# Patient Record
Sex: Female | Born: 1940 | Race: Black or African American | Hispanic: No | Smoking: Never smoker
Health system: Southern US, Community
[De-identification: ages and names within clinical notes are randomized; demographics above are authoritative.]

## PROBLEM LIST (undated history)

## (undated) DIAGNOSIS — T7840XA Allergy, unspecified, initial encounter: Secondary | ICD-10-CM

## (undated) DIAGNOSIS — R3129 Other microscopic hematuria: Secondary | ICD-10-CM

## (undated) DIAGNOSIS — E785 Hyperlipidemia, unspecified: Secondary | ICD-10-CM

## (undated) DIAGNOSIS — R7611 Nonspecific reaction to tuberculin skin test without active tuberculosis: Secondary | ICD-10-CM

## (undated) DIAGNOSIS — Z87440 Personal history of urinary (tract) infections: Secondary | ICD-10-CM

## (undated) DIAGNOSIS — F419 Anxiety disorder, unspecified: Secondary | ICD-10-CM

## (undated) DIAGNOSIS — K219 Gastro-esophageal reflux disease without esophagitis: Secondary | ICD-10-CM

## (undated) HISTORY — DX: Personal history of urinary (tract) infections: Z87.440

## (undated) HISTORY — DX: Other microscopic hematuria: R31.29

## (undated) HISTORY — DX: Nonspecific reaction to tuberculin skin test without active tuberculosis: R76.11

## (undated) HISTORY — DX: Hyperlipidemia, unspecified: E78.5

## (undated) HISTORY — PX: CHOLECYSTECTOMY: SHX55

## (undated) HISTORY — DX: Anxiety disorder, unspecified: F41.9

## (undated) HISTORY — PX: ABDOMINAL HYSTERECTOMY: SHX81

## (undated) HISTORY — DX: Allergy, unspecified, initial encounter: T78.40XA

## (undated) HISTORY — PX: CYSTOSTOMY W/ BLADDER BIOPSY: SHX1431

## (undated) HISTORY — DX: Gastro-esophageal reflux disease without esophagitis: K21.9

---

## 1976-01-04 HISTORY — PX: BREAST SURGERY: SHX581

## 1997-07-29 ENCOUNTER — Other Ambulatory Visit: Admission: RE | Admit: 1997-07-29 | Discharge: 1997-07-29 | Payer: Self-pay | Admitting: Urology

## 1997-12-01 ENCOUNTER — Other Ambulatory Visit: Admission: RE | Admit: 1997-12-01 | Discharge: 1997-12-01 | Payer: Self-pay | Admitting: Gastroenterology

## 1998-04-15 ENCOUNTER — Emergency Department (HOSPITAL_COMMUNITY): Admission: EM | Admit: 1998-04-15 | Discharge: 1998-04-15 | Payer: Self-pay | Admitting: Emergency Medicine

## 1998-09-30 ENCOUNTER — Other Ambulatory Visit: Admission: RE | Admit: 1998-09-30 | Discharge: 1998-09-30 | Payer: Self-pay | Admitting: Urology

## 1998-10-13 ENCOUNTER — Other Ambulatory Visit: Admission: RE | Admit: 1998-10-13 | Discharge: 1998-10-13 | Payer: Self-pay | Admitting: Internal Medicine

## 1999-07-13 ENCOUNTER — Encounter: Admission: RE | Admit: 1999-07-13 | Discharge: 1999-07-13 | Payer: Self-pay | Admitting: Internal Medicine

## 1999-07-13 ENCOUNTER — Encounter: Payer: Self-pay | Admitting: Internal Medicine

## 1999-07-14 ENCOUNTER — Encounter: Admission: RE | Admit: 1999-07-14 | Discharge: 1999-07-28 | Payer: Self-pay | Admitting: Internal Medicine

## 1999-09-29 ENCOUNTER — Other Ambulatory Visit: Admission: RE | Admit: 1999-09-29 | Discharge: 1999-09-29 | Payer: Self-pay | Admitting: Internal Medicine

## 1999-10-20 ENCOUNTER — Other Ambulatory Visit: Admission: RE | Admit: 1999-10-20 | Discharge: 1999-10-20 | Payer: Self-pay | Admitting: Urology

## 1999-10-26 ENCOUNTER — Encounter: Admission: RE | Admit: 1999-10-26 | Discharge: 2000-01-24 | Payer: Self-pay | Admitting: Internal Medicine

## 2000-10-26 ENCOUNTER — Other Ambulatory Visit: Admission: RE | Admit: 2000-10-26 | Discharge: 2000-10-26 | Payer: Self-pay | Admitting: Urology

## 2000-11-10 ENCOUNTER — Other Ambulatory Visit: Admission: RE | Admit: 2000-11-10 | Discharge: 2000-11-10 | Payer: Self-pay | Admitting: Internal Medicine

## 2001-06-12 ENCOUNTER — Encounter: Admission: RE | Admit: 2001-06-12 | Discharge: 2001-09-10 | Payer: Self-pay | Admitting: Internal Medicine

## 2001-11-22 ENCOUNTER — Other Ambulatory Visit: Admission: RE | Admit: 2001-11-22 | Discharge: 2001-11-22 | Payer: Self-pay | Admitting: Urology

## 2001-12-10 ENCOUNTER — Other Ambulatory Visit: Admission: RE | Admit: 2001-12-10 | Discharge: 2001-12-10 | Payer: Self-pay | Admitting: Internal Medicine

## 2001-12-24 ENCOUNTER — Other Ambulatory Visit: Admission: RE | Admit: 2001-12-24 | Discharge: 2001-12-24 | Payer: Self-pay | Admitting: Urology

## 2002-12-19 ENCOUNTER — Other Ambulatory Visit: Admission: RE | Admit: 2002-12-19 | Discharge: 2002-12-19 | Payer: Self-pay | Admitting: Internal Medicine

## 2003-08-28 ENCOUNTER — Encounter: Admission: RE | Admit: 2003-08-28 | Discharge: 2003-08-28 | Payer: Self-pay | Admitting: Internal Medicine

## 2003-09-04 ENCOUNTER — Encounter: Admission: RE | Admit: 2003-09-04 | Discharge: 2003-09-11 | Payer: Self-pay | Admitting: Internal Medicine

## 2003-12-15 ENCOUNTER — Encounter (INDEPENDENT_AMBULATORY_CARE_PROVIDER_SITE_OTHER): Payer: Self-pay | Admitting: *Deleted

## 2003-12-15 ENCOUNTER — Ambulatory Visit (HOSPITAL_COMMUNITY): Admission: RE | Admit: 2003-12-15 | Discharge: 2003-12-15 | Payer: Self-pay | Admitting: Gastroenterology

## 2004-02-17 ENCOUNTER — Other Ambulatory Visit: Admission: RE | Admit: 2004-02-17 | Discharge: 2004-02-17 | Payer: Self-pay | Admitting: Internal Medicine

## 2004-04-06 ENCOUNTER — Ambulatory Visit: Payer: Self-pay

## 2004-04-26 ENCOUNTER — Encounter: Admission: RE | Admit: 2004-04-26 | Discharge: 2004-04-26 | Payer: Self-pay | Admitting: Orthopedic Surgery

## 2005-02-17 ENCOUNTER — Other Ambulatory Visit: Admission: RE | Admit: 2005-02-17 | Discharge: 2005-02-17 | Payer: Self-pay | Admitting: Internal Medicine

## 2006-08-29 ENCOUNTER — Encounter: Admission: RE | Admit: 2006-08-29 | Discharge: 2006-08-29 | Payer: Self-pay | Admitting: Internal Medicine

## 2007-11-02 ENCOUNTER — Ambulatory Visit: Payer: Self-pay | Admitting: Internal Medicine

## 2008-03-04 ENCOUNTER — Ambulatory Visit: Payer: Self-pay | Admitting: Internal Medicine

## 2008-03-04 ENCOUNTER — Other Ambulatory Visit: Admission: RE | Admit: 2008-03-04 | Discharge: 2008-03-04 | Payer: Self-pay | Admitting: Internal Medicine

## 2008-06-10 ENCOUNTER — Ambulatory Visit: Payer: Self-pay | Admitting: Internal Medicine

## 2008-09-09 ENCOUNTER — Ambulatory Visit: Payer: Self-pay | Admitting: Internal Medicine

## 2008-11-04 ENCOUNTER — Ambulatory Visit: Payer: Self-pay | Admitting: Internal Medicine

## 2008-11-24 ENCOUNTER — Emergency Department (HOSPITAL_COMMUNITY): Admission: EM | Admit: 2008-11-24 | Discharge: 2008-11-24 | Payer: Self-pay | Admitting: Emergency Medicine

## 2008-12-02 ENCOUNTER — Ambulatory Visit: Payer: Self-pay | Admitting: Internal Medicine

## 2009-05-05 ENCOUNTER — Ambulatory Visit: Payer: Self-pay | Admitting: Internal Medicine

## 2009-08-04 ENCOUNTER — Ambulatory Visit: Payer: Self-pay | Admitting: Internal Medicine

## 2009-11-10 ENCOUNTER — Ambulatory Visit: Payer: Self-pay | Admitting: Internal Medicine

## 2010-04-07 LAB — CBC
HCT: 42.1 % (ref 36.0–46.0)
MCHC: 33.8 g/dL (ref 30.0–36.0)
MCV: 93.8 fL (ref 78.0–100.0)
Platelets: 252 10*3/uL (ref 150–400)
RBC: 4.48 MIL/uL (ref 3.87–5.11)
WBC: 9.7 10*3/uL (ref 4.0–10.5)

## 2010-04-07 LAB — URINALYSIS, ROUTINE W REFLEX MICROSCOPIC
Glucose, UA: 250 mg/dL — AB
Leukocytes, UA: NEGATIVE
pH: 8 (ref 5.0–8.0)

## 2010-04-07 LAB — URINE MICROSCOPIC-ADD ON

## 2010-04-07 LAB — DIFFERENTIAL
Basophils Absolute: 0 10*3/uL (ref 0.0–0.1)
Basophils Relative: 0 % (ref 0–1)
Lymphocytes Relative: 7 % — ABNORMAL LOW (ref 12–46)

## 2010-04-07 LAB — COMPREHENSIVE METABOLIC PANEL
AST: 22 U/L (ref 0–37)
Alkaline Phosphatase: 82 U/L (ref 39–117)
CO2: 28 mEq/L (ref 19–32)
Calcium: 9.2 mg/dL (ref 8.4–10.5)
Chloride: 98 mEq/L (ref 96–112)
GFR calc Af Amer: >60 mL/min (ref >60)
GFR calc non Af Amer: >60 mL/min (ref >60)
Glucose, Bld: 202 mg/dL — ABNORMAL HIGH (ref 70–99)
Potassium: 2.9 mEq/L — ABNORMAL LOW (ref 3.5–5.1)

## 2010-05-10 ENCOUNTER — Other Ambulatory Visit: Payer: Medicare Other | Admitting: Internal Medicine

## 2010-05-11 ENCOUNTER — Ambulatory Visit (INDEPENDENT_AMBULATORY_CARE_PROVIDER_SITE_OTHER): Payer: Medicare Other | Admitting: Internal Medicine

## 2010-05-11 ENCOUNTER — Encounter: Payer: Self-pay | Admitting: Internal Medicine

## 2010-05-11 VITALS — BP 149/78 | HR 86 | Temp 97.6°F | Ht 61.0 in | Wt 196.0 lb

## 2010-05-11 DIAGNOSIS — E559 Vitamin D deficiency, unspecified: Secondary | ICD-10-CM | POA: Insufficient documentation

## 2010-05-11 DIAGNOSIS — E785 Hyperlipidemia, unspecified: Secondary | ICD-10-CM

## 2010-05-11 DIAGNOSIS — R3129 Other microscopic hematuria: Secondary | ICD-10-CM

## 2010-05-11 DIAGNOSIS — M5126 Other intervertebral disc displacement, lumbar region: Secondary | ICD-10-CM

## 2010-05-11 DIAGNOSIS — J309 Allergic rhinitis, unspecified: Secondary | ICD-10-CM

## 2010-05-11 DIAGNOSIS — B372 Candidiasis of skin and nail: Secondary | ICD-10-CM

## 2010-05-11 DIAGNOSIS — K219 Gastro-esophageal reflux disease without esophagitis: Secondary | ICD-10-CM

## 2010-05-11 DIAGNOSIS — Z860101 Personal history of adenomatous and serrated colon polyps: Secondary | ICD-10-CM

## 2010-05-11 DIAGNOSIS — Z86010 Personal history of colonic polyps: Secondary | ICD-10-CM

## 2010-05-11 DIAGNOSIS — E119 Type 2 diabetes mellitus without complications: Secondary | ICD-10-CM

## 2010-05-11 DIAGNOSIS — Z87440 Personal history of urinary (tract) infections: Secondary | ICD-10-CM

## 2010-05-11 HISTORY — DX: Personal history of urinary (tract) infections: Z87.440

## 2010-05-11 LAB — POCT URINALYSIS DIPSTICK
Bilirubin, UA: NEGATIVE
Glucose, UA: NEGATIVE
Nitrite, UA: NEGATIVE
Protein, UA: NEGATIVE
Urobilinogen, UA: NEGATIVE
pH, UA: 5

## 2010-05-11 MED ORDER — ATORVASTATIN CALCIUM 20 MG PO TABS: 20.0000 mg | ORAL_TABLET | Freq: Every day | ORAL | Status: DC

## 2010-05-11 NOTE — Progress Notes (Signed)
  Subjective:    Patient ID: Leah Barron, female    DOB: June 07, 1940, 70 y.o.   MRN: [id number]  HPI 70 year old Black female here for evaluation of medical problems and health maintenance. Multiple medical problems including HTN, GERD,Hyperlipidemia, microscopic hematuria, adenomatous colon polyps, allergic rhinitis, anxiety,intertrigo, herniated disc L5-S1,AODM,Hx +PPD,Vitamin D deficiency.    Review of Systems  Constitutional: Positive for fatigue. Negative for appetite change and unexpected weight change.  HENT: Positive for tinnitus.   Eyes: Positive for itching.  Respiratory: Negative for cough, chest tightness, shortness of breath and wheezing.   Cardiovascular: Negative for chest pain, palpitations and leg swelling.  Genitourinary: Positive for frequency. Negative for dysuria and difficulty urinating.  Musculoskeletal: Negative for joint swelling.  Neurological: Negative for dizziness, seizures, syncope, weakness and headaches.  Hematological: Does not bruise/bleed easily.  Psychiatric/Behavioral: Negative for hallucinations, confusion and dysphoric mood.       Objective:   Physical Exam  Constitutional: She is oriented to person, place, and time. She appears well-nourished.  HENT:  Head: Normocephalic and atraumatic.  Eyes: Conjunctivae are normal. Pupils are equal, round, and reactive to light. No scleral icterus.  Neck: Neck supple. No JVD present. No tracheal deviation present. No thyromegaly present.  Cardiovascular: Normal rate, regular rhythm, normal heart sounds and intact distal pulses.   No murmur heard. Pulmonary/Chest: Effort normal and breath sounds normal. No respiratory distress. She has no wheezes. She has no rales.  Abdominal: She exhibits no distension and no mass. There is no tenderness. There is no rebound and no guarding.  Musculoskeletal: She exhibits no edema and no tenderness.  Lymphadenopathy:    She has no cervical adenopathy.  Neurological: She  is alert and oriented to person, place, and time. She has normal reflexes. No cranial nerve deficit. She exhibits normal muscle tone. Coordination normal.  Skin: Skin is warm and dry. No rash noted. No erythema.  Psychiatric: She has a normal mood and affect. Her behavior is normal. Judgment and thought content normal.          Assessment & Plan:  AODM_ fairly well controlled; Hypertension controlled; GERD-stable;  Adenomatous polyps-Pt to call Dr Dorena Cookey about repeat study; Intertrigo-stable; Allergic rhinitis- treated with OTC meds; Anxiety-stable; Back pain has gone away for now. Microscopic hematuria to be checked by urologist in Sept 2012. Hyperlipidemia under excellent control on generic Lipitor but co-pay is $45.  RTC in 6 months and will have AIC,Lipid and Liver panels with OV.

## 2010-05-12 IMAGING — CT CT HEAD W/O CM
1 of 2 series · 13 of 30 positions shown, 17 images · non-contrast
Comparison: None

CLINICAL DATA: Vertigo with nausea, vomiting and headache.  History
of hypertension and diabetes.

CT HEAD WITHOUT CONTRAST
TECHNIQUE: Contiguous axial images were obtained from the base of
the skull through the vertex without contrast.

[Series 2: brain · axial · 0.47mm/px · z∈[+146,+271]mm · 13 of 28 slices shown, 17 images]
[im 2/28  brain]
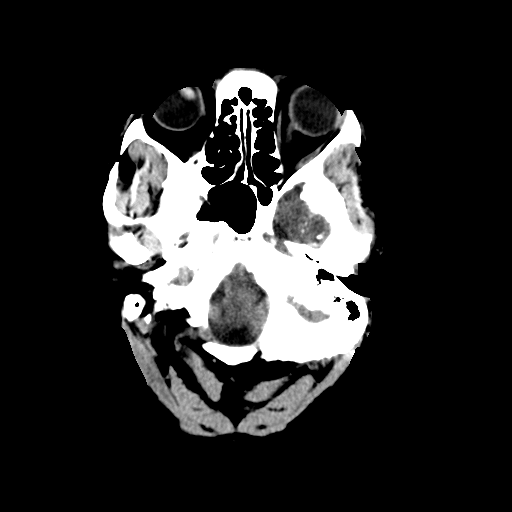
[im 2/28  bone]
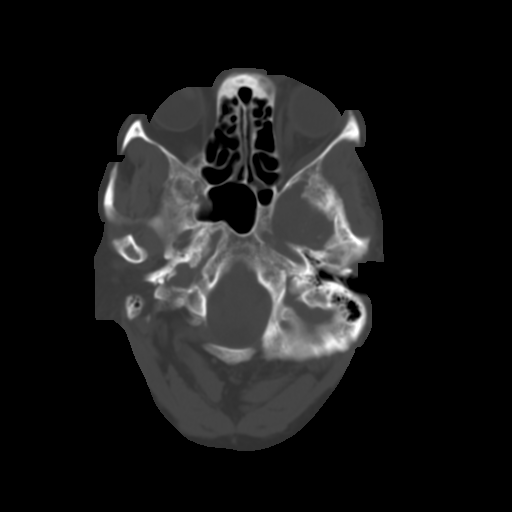
[im 4/28  brain]
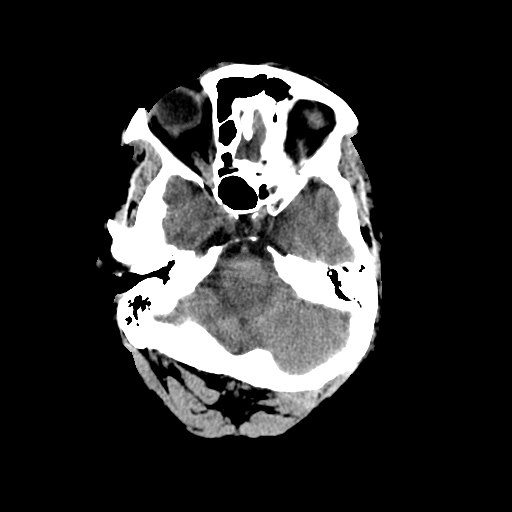
[im 6/28  brain]
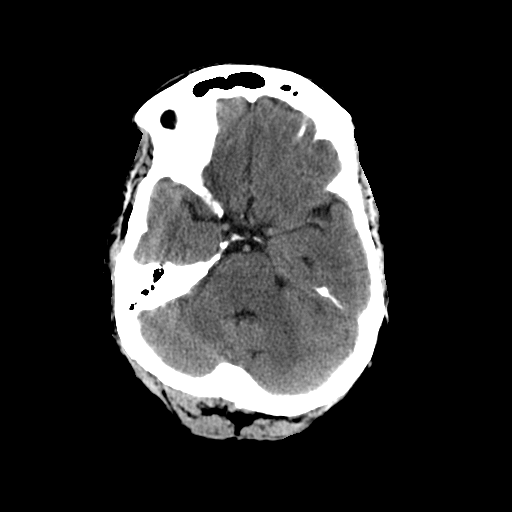
[im 8/28  brain]
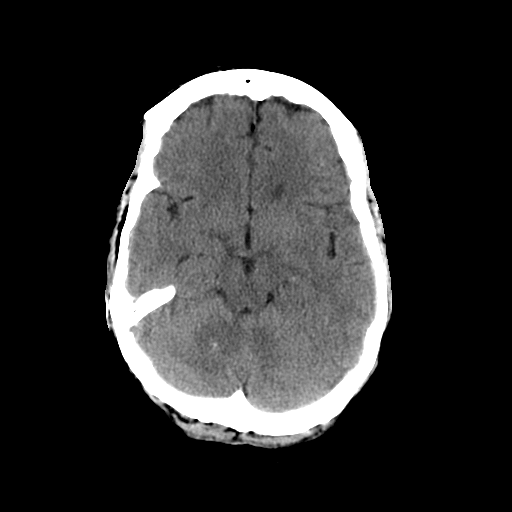
[im 10/28  brain]
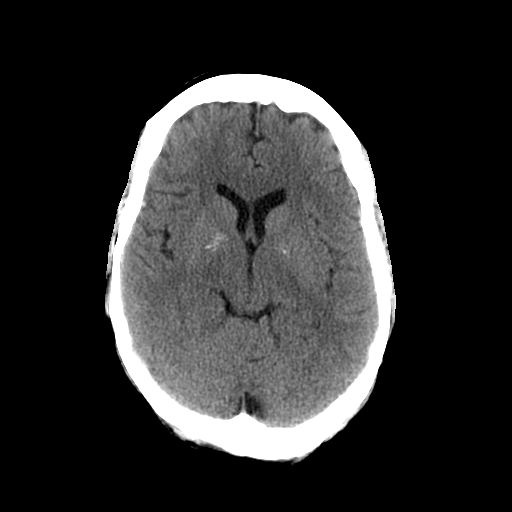
[im 10/28  bone]
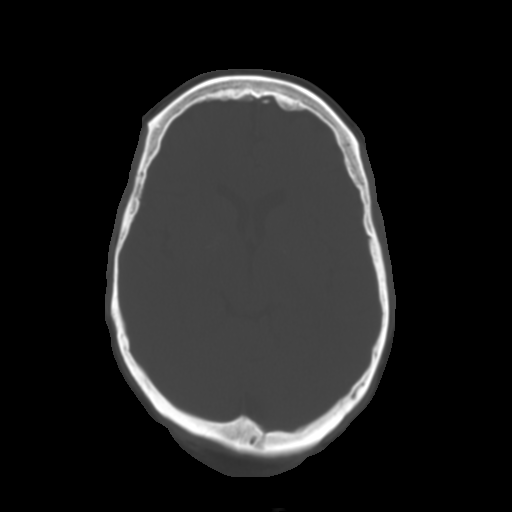
[im 12/28  brain]
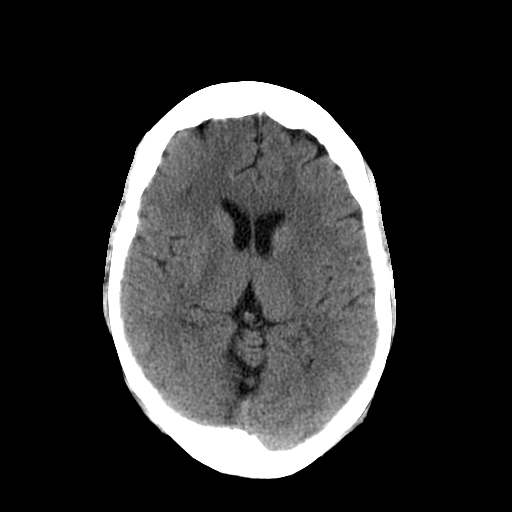
[im 14/28  brain]
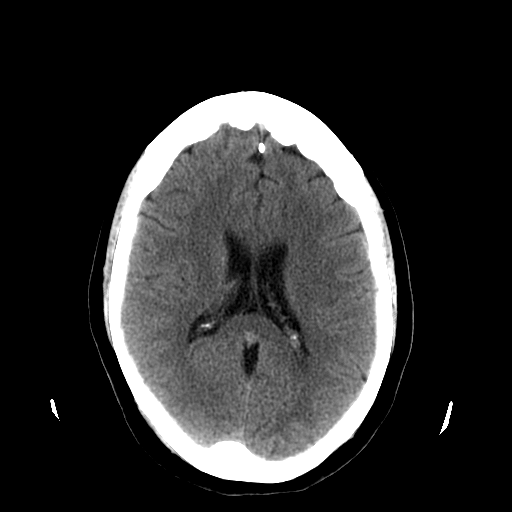
[im 16/28  brain]
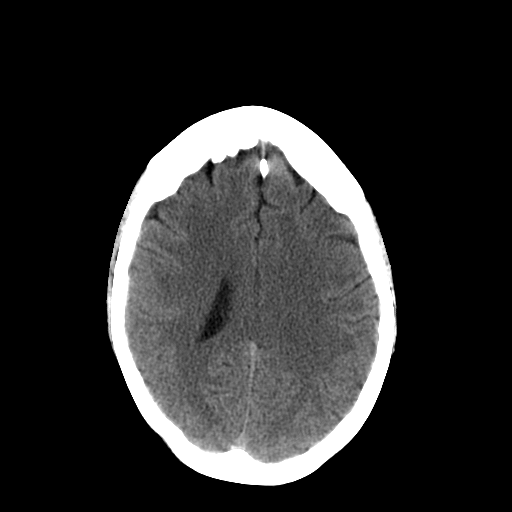
[im 18/28  brain]
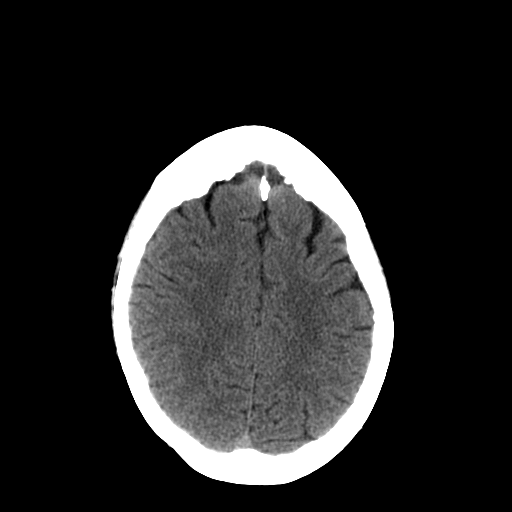
[im 18/28  bone]
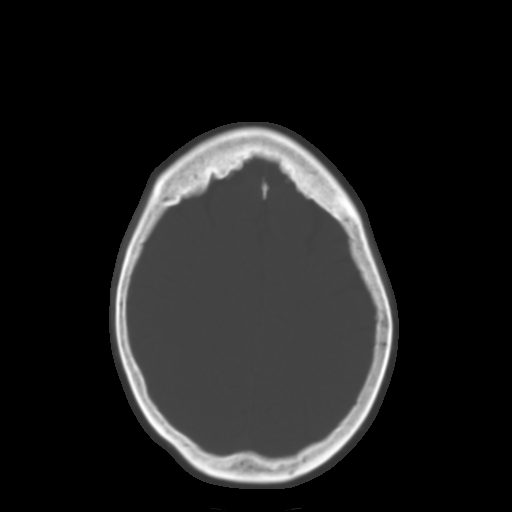
[im 20/28  brain]
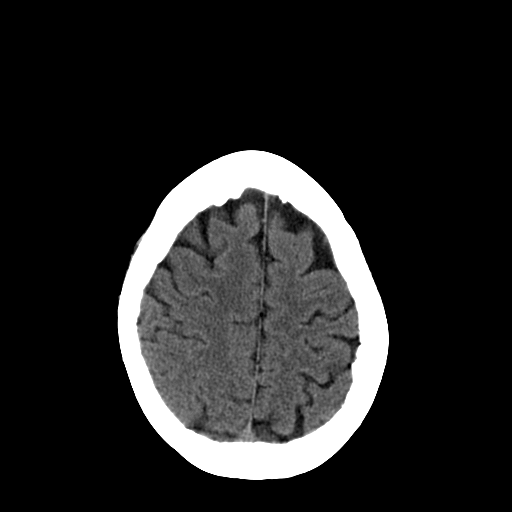
[im 22/28  brain]
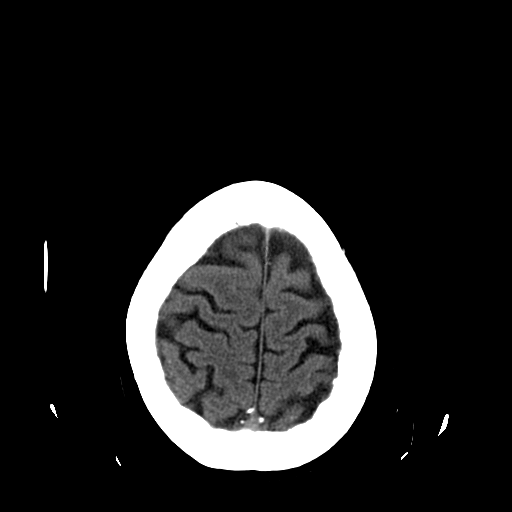
[im 24/28  brain]
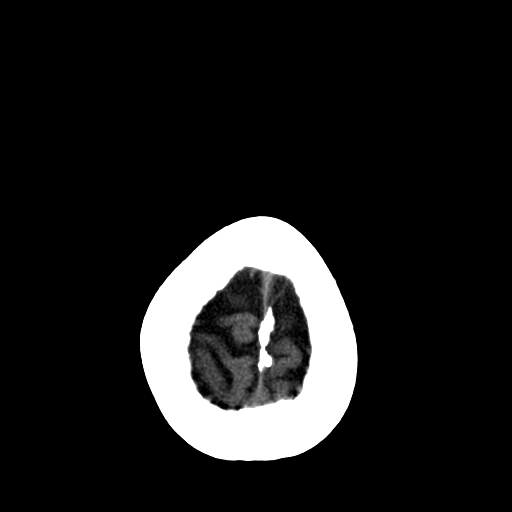
[im 26/28  brain]
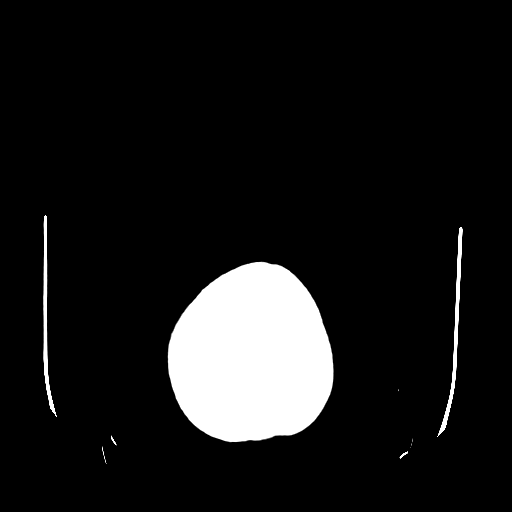
[im 26/28  bone]
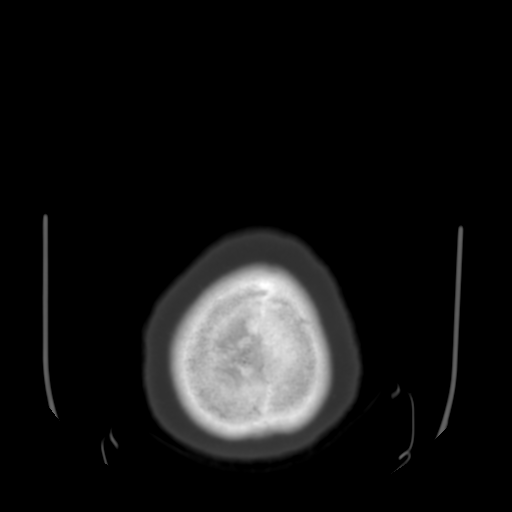

[13 of 30 positions shown; findings below may reference images not displayed]

FINDINGS: There is no evidence of acute intracranial hemorrhage,
mass lesion, brain edema or extra-axial fluid collection.  The
ventricles and subarachnoid spaces are appropriately sized for age.
There is no CT evidence of acute cortical infarction.  There are
bilateral basal ganglia calcifications.  Minimal small vessel
ischemic changes are present in the subcortical white matter
bilaterally.

The visualized paranasal sinuses are clear. The calvarium is
intact.
IMPRESSION: No acute intracranial findings.

## 2010-05-21 NOTE — Op Note (Signed)
NAME:  Leah Barron, Leah Barron               ACCOUNT NO.:  [id number]   MEDICAL RECORD NO.:  [id number]          PATIENT TYPE:  AMB   LOCATION:  ENDO                         FACILITY:  [facility]   PHYSICIAN:  John C. Madilyn Fireman, M.D.    DATE OF BIRTH:  Aug 20, 1940   DATE OF PROCEDURE:  12/15/2003  DATE OF DISCHARGE:                                 OPERATIVE REPORT   PROCEDURE PERFORMED:  Colonoscopy.   ENDOSCOPIST:  Barrie Folk, M.D.   INDICATIONS FOR PROCEDURE:  Average risk colon cancer screening in a 70-year-  old patient.   DESCRIPTION OF PROCEDURE:  The patient was placed in the left lateral  decubitus position and placed on the pulse monitor with continuous low-flow  oxygen delivered by nasal cannula.  The patient was sedated with 50 mcg of  IV fentanyl and 5 mg of IV Versed.  The Olympus video colonoscope was  inserted into the rectum and advanced to the cecum, confirmed by  transillumination of McBurney's point and visualization of the ileocecal  valve and appendiceal orifice.  The prep was good.  In the base of the cecum  there was an 8 mm polyp that was removed by a snare.  A second 8 mm sessile  polyp was removed in the ascending colon but was lost to retrieval despite  fairly thorough search.  It had a fairly benign appearance.  The remainder  of the ascending and transverse colon appeared normal with no further  polyps, masses or diverticula or other mucosal abnormalities.  Within the  descending and sigmoid colon there were multiple diverticula, no other  abnormalities.  The rectum appeared normal and retroflex view of the anus  revealed no obvious internal hemorrhoids.  The scope was then withdrawn and  the patient returned to the recovery room in stable condition.  She  tolerated the procedure well.  There were no immediate complications.   IMPRESSION:  1.  Cecal and ascending colon polyps with ascending polyp lost after      removal.  2.  Left-sided diverticulosis.   PLAN:   Await histology and will, probably pursue.  Repeat colonoscopy in  three years.      JCH/MEDQ  D:  12/15/2003  T:  12/15/2003  Job:  [id number]   cc:   Luanna Cole. Lenord Fellers, M.D.  [address], [place]  [place]  [place] [address]  Fax: [phone]

## 2010-05-30 ENCOUNTER — Emergency Department (HOSPITAL_COMMUNITY)
Admission: EM | Admit: 2010-05-30 | Discharge: 2010-05-30 | Disposition: A | Discharge status: Home / Self Care | Payer: Medicare Other | Attending: Emergency Medicine | Admitting: Emergency Medicine

## 2010-05-30 DIAGNOSIS — I10 Essential (primary) hypertension: Secondary | ICD-10-CM | POA: Insufficient documentation

## 2010-05-30 DIAGNOSIS — E7800 Pure hypercholesterolemia, unspecified: Secondary | ICD-10-CM | POA: Insufficient documentation

## 2010-05-30 DIAGNOSIS — E119 Type 2 diabetes mellitus without complications: Secondary | ICD-10-CM | POA: Insufficient documentation

## 2010-05-30 DIAGNOSIS — M542 Cervicalgia: Secondary | ICD-10-CM | POA: Insufficient documentation

## 2010-05-30 DIAGNOSIS — K219 Gastro-esophageal reflux disease without esophagitis: Secondary | ICD-10-CM | POA: Insufficient documentation

## 2010-08-02 ENCOUNTER — Other Ambulatory Visit: Payer: Self-pay | Admitting: Internal Medicine

## 2010-08-02 DIAGNOSIS — I10 Essential (primary) hypertension: Secondary | ICD-10-CM

## 2010-08-28 ENCOUNTER — Other Ambulatory Visit: Payer: Self-pay | Admitting: Internal Medicine

## 2010-10-25 ENCOUNTER — Other Ambulatory Visit: Payer: Self-pay | Admitting: Internal Medicine

## 2010-10-26 ENCOUNTER — Other Ambulatory Visit: Payer: Self-pay

## 2010-10-26 MED ORDER — METFORMIN HCL 500 MG PO TABS: 500.0000 mg | ORAL_TABLET | ORAL | Status: DC

## 2010-11-02 ENCOUNTER — Other Ambulatory Visit: Payer: Self-pay | Admitting: Internal Medicine

## 2010-11-02 NOTE — Telephone Encounter (Signed)
Refill for 6 months to pharmacy

## 2010-11-08 ENCOUNTER — Other Ambulatory Visit: Payer: Self-pay | Admitting: Internal Medicine

## 2010-11-16 ENCOUNTER — Encounter: Payer: Self-pay | Admitting: Internal Medicine

## 2010-11-16 ENCOUNTER — Ambulatory Visit (INDEPENDENT_AMBULATORY_CARE_PROVIDER_SITE_OTHER): Payer: Medicare Other | Admitting: Internal Medicine

## 2010-11-16 VITALS — BP 140/72 | HR 68 | Temp 99.1°F | Wt 194.0 lb

## 2010-11-16 DIAGNOSIS — R609 Edema, unspecified: Secondary | ICD-10-CM

## 2010-11-16 DIAGNOSIS — Z23 Encounter for immunization: Secondary | ICD-10-CM

## 2010-11-16 DIAGNOSIS — Z79899 Other long term (current) drug therapy: Secondary | ICD-10-CM

## 2010-11-16 DIAGNOSIS — E785 Hyperlipidemia, unspecified: Secondary | ICD-10-CM

## 2010-11-16 DIAGNOSIS — E119 Type 2 diabetes mellitus without complications: Secondary | ICD-10-CM

## 2010-11-16 DIAGNOSIS — I10 Essential (primary) hypertension: Secondary | ICD-10-CM

## 2010-11-16 LAB — LIPID PANEL
LDL Cholesterol: 69 mg/dL (ref 0–99)
Total CHOL/HDL Ratio: 2.7 Ratio

## 2010-11-16 LAB — HEPATIC FUNCTION PANEL
AST: 17 U/L (ref 0–37)
Bilirubin, Direct: 0.2 mg/dL (ref 0.0–0.3)
Indirect Bilirubin: 0.4 mg/dL (ref 0.0–0.9)
Total Protein: 7.4 g/dL (ref 6.0–8.3)

## 2010-11-16 MED ORDER — TETANUS-DIPHTH-ACELL PERTUSSIS 5-2.5-18.5 LF-MCG/0.5 IM SUSP: 0.5000 mL | Freq: Once | INTRAMUSCULAR | Status: DC

## 2010-11-16 NOTE — Patient Instructions (Signed)
Continue same medication regimen. We will mail you a copy of your lab results. You have been given a tetanus immunization today with pertussis. This is good for 10 years. Return in 6 months for physical exam.

## 2010-11-16 NOTE — Progress Notes (Signed)
  Subjective:    Patient ID: Leah Barron, female    DOB: 1940-12-29, 70 y.o.   MRN: [id number]  HPI for followup of adult onset diabetes, hyperlipidemia, adenomatous colon polyps, herniated disc L5-S1, microscopic hematuria, dependent edema, hypertension. Tdap vaccine given today. Will see optometrist December 2012. Colonoscopy due December 2013. Has annual mammogram. Since she was last seen, she went to the emergency department with back spasm. Has bouts of back pain from time to time which we have treated with Vicodin, Flexeril and sometimes a steroid dosepak. Is out of Vicodin. Takes Xanax for anxiety.    Review of Systems     Objective:   Physical Exam chest is clear to auscultation; cardiac exam regular rate and rhythm; extremities without edema. Diabetic foot exam: No tinea pedis, pulses are normal in feet. No evidence of peripheral neuropathy.        Assessment & Plan:  Adult onset diabetes  Hypertension  Dependent edema  Hyperlipidemia  History of adenomatous colon polyp  Herniated disc L5-S1 with recurrent low back pain  Plan: Vicodin 5/500 (#60) 1 by mouth every 6 hours when necessary pain with 2 refills. Xanax 0.5 mg (#60) 1 by mouth twice daily for anxiety with 5 refills; HCTZ 25 mg (#90) one by mouth daily with when necessary 1 year refill. Return in 6 months for physical examination. Tdap vaccine given today. Had shingles vaccine November 2011, Pneumovax immunization November 2011. Got influenza immunization at drugstore October 2012

## 2010-12-01 ENCOUNTER — Telehealth: Payer: Self-pay | Admitting: Internal Medicine

## 2010-12-01 NOTE — Telephone Encounter (Signed)
Called in refill for 1 year for diabetic test strips and lancets per Dr. Lenord Fellers.

## 2011-02-18 ENCOUNTER — Other Ambulatory Visit: Payer: Self-pay | Admitting: Internal Medicine

## 2011-03-24 ENCOUNTER — Encounter: Payer: Self-pay | Admitting: Internal Medicine

## 2011-04-07 ENCOUNTER — Encounter: Payer: Self-pay | Admitting: Internal Medicine

## 2011-04-15 ENCOUNTER — Other Ambulatory Visit: Payer: Self-pay | Admitting: Internal Medicine

## 2011-05-17 ENCOUNTER — Ambulatory Visit (INDEPENDENT_AMBULATORY_CARE_PROVIDER_SITE_OTHER): Payer: Medicare Other | Admitting: Internal Medicine

## 2011-05-17 ENCOUNTER — Encounter: Payer: Self-pay | Admitting: Internal Medicine

## 2011-05-17 DIAGNOSIS — R3129 Other microscopic hematuria: Secondary | ICD-10-CM

## 2011-05-17 DIAGNOSIS — F411 Generalized anxiety disorder: Secondary | ICD-10-CM

## 2011-05-17 DIAGNOSIS — M519 Unspecified thoracic, thoracolumbar and lumbosacral intervertebral disc disorder: Secondary | ICD-10-CM

## 2011-05-17 DIAGNOSIS — Z79899 Other long term (current) drug therapy: Secondary | ICD-10-CM

## 2011-05-17 DIAGNOSIS — Z9289 Personal history of other medical treatment: Secondary | ICD-10-CM

## 2011-05-17 DIAGNOSIS — R5383 Other fatigue: Secondary | ICD-10-CM

## 2011-05-17 DIAGNOSIS — Z0000 Encounter for general adult medical examination without abnormal findings: Secondary | ICD-10-CM

## 2011-05-17 DIAGNOSIS — R5381 Other malaise: Secondary | ICD-10-CM

## 2011-05-17 DIAGNOSIS — E119 Type 2 diabetes mellitus without complications: Secondary | ICD-10-CM

## 2011-05-17 DIAGNOSIS — F419 Anxiety disorder, unspecified: Secondary | ICD-10-CM

## 2011-05-17 DIAGNOSIS — L538 Other specified erythematous conditions: Secondary | ICD-10-CM

## 2011-05-17 DIAGNOSIS — L304 Erythema intertrigo: Secondary | ICD-10-CM

## 2011-05-17 DIAGNOSIS — E785 Hyperlipidemia, unspecified: Secondary | ICD-10-CM

## 2011-05-17 DIAGNOSIS — K219 Gastro-esophageal reflux disease without esophagitis: Secondary | ICD-10-CM

## 2011-05-17 DIAGNOSIS — Z86010 Personal history of colonic polyps: Secondary | ICD-10-CM

## 2011-05-17 LAB — COMPREHENSIVE METABOLIC PANEL
ALT: 41 U/L — ABNORMAL HIGH (ref 0–35)
CO2: 31 mEq/L (ref 19–32)
Calcium: 9.8 mg/dL (ref 8.4–10.5)
Chloride: 102 mEq/L (ref 96–112)
Glucose, Bld: 151 mg/dL — ABNORMAL HIGH (ref 70–99)
Potassium: 3.8 mEq/L (ref 3.5–5.3)
Sodium: 141 mEq/L (ref 135–145)
Total Bilirubin: 0.6 mg/dL (ref 0.3–1.2)
Total Protein: 7.1 g/dL (ref 6.0–8.3)

## 2011-05-17 LAB — HEMOGLOBIN A1C
Hgb A1c MFr Bld: 6.9 % — ABNORMAL HIGH (ref <5.7)
Mean Plasma Glucose: 151 mg/dL — ABNORMAL HIGH (ref <117)

## 2011-05-17 LAB — POCT URINALYSIS DIPSTICK
Glucose, UA: NEGATIVE
Leukocytes, UA: NEGATIVE
Nitrite, UA: NEGATIVE
Spec Grav, UA: 1.01
Urobilinogen, UA: NEGATIVE

## 2011-05-17 LAB — CBC WITH DIFFERENTIAL/PLATELET
Eosinophils Absolute: 0.1 10*3/uL (ref 0.0–0.7)
Eosinophils Relative: 1 % (ref 0–5)
HCT: 39.4 % (ref 36.0–46.0)
Lymphocytes Relative: 38 % (ref 12–46)
Lymphs Abs: 2.2 10*3/uL (ref 0.7–4.0)
Monocytes Absolute: 0.4 10*3/uL (ref 0.1–1.0)
Monocytes Relative: 7 % (ref 3–12)
Neutro Abs: 3.1 10*3/uL (ref 1.7–7.7)
RBC: 4.22 MIL/uL (ref 3.87–5.11)

## 2011-05-17 LAB — LIPID PANEL
Cholesterol: 164 mg/dL (ref 0–200)
HDL: 52 mg/dL (ref >39)
LDL Cholesterol: 69 mg/dL (ref 0–99)
Triglycerides: 217 mg/dL — ABNORMAL HIGH (ref <150)

## 2011-05-18 LAB — VITAMIN D 25 HYDROXY (VIT D DEFICIENCY, FRACTURES): Vit D, 25-Hydroxy: 42 ng/mL (ref 30–89)

## 2011-05-18 LAB — MICROALBUMIN, URINE: Microalb, Ur: 2.68 mg/dL — ABNORMAL HIGH (ref 0.00–1.89)

## 2011-06-06 ENCOUNTER — Other Ambulatory Visit: Payer: Self-pay | Admitting: Internal Medicine

## 2011-06-10 ENCOUNTER — Other Ambulatory Visit: Payer: Self-pay | Admitting: Internal Medicine

## 2011-06-10 ENCOUNTER — Other Ambulatory Visit: Payer: Self-pay

## 2011-06-10 MED ORDER — ALPRAZOLAM 0.5 MG PO TABS: 0.5000 mg | ORAL_TABLET | Freq: Two times a day (BID) | ORAL | Status: DC

## 2011-06-10 NOTE — Telephone Encounter (Signed)
meds refilled 

## 2011-06-23 ENCOUNTER — Other Ambulatory Visit: Payer: Self-pay

## 2011-08-01 ENCOUNTER — Other Ambulatory Visit: Payer: Self-pay

## 2011-08-01 DIAGNOSIS — I10 Essential (primary) hypertension: Secondary | ICD-10-CM

## 2011-08-01 MED ORDER — RAMIPRIL 2.5 MG PO CAPS: 2.5000 mg | ORAL_CAPSULE | Freq: Every day | ORAL | Status: DC

## 2011-08-09 ENCOUNTER — Other Ambulatory Visit: Payer: Self-pay

## 2011-09-05 DIAGNOSIS — Z9289 Personal history of other medical treatment: Secondary | ICD-10-CM | POA: Insufficient documentation

## 2011-09-05 DIAGNOSIS — L304 Erythema intertrigo: Secondary | ICD-10-CM | POA: Insufficient documentation

## 2011-09-05 DIAGNOSIS — F419 Anxiety disorder, unspecified: Secondary | ICD-10-CM | POA: Insufficient documentation

## 2011-09-05 NOTE — Progress Notes (Signed)
Subjective:    Patient ID: Leah Barron, female    DOB: 03-28-1940, 71 y.o.   MRN: [id number]  HPI 71 year old black female in today for health maintenance exam and evaluation of multiple medical problems including  type 2 diabetes mellitus, metabolic syndrome, obesity, hyperlipidemia and microscopic hematuria previously evaluated by urologist, GE reflux, history of adenomatous colon polyps, lumbar disc herniation L5-S1, vitamin D deficiency, anxiety.  Patient says she's been under a lot of stress. She had a niece passed away. Patient's sister died of cardiac arrest in 02/02/2022. She's had cataract surgery. 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 Does not smoke or consume alcohol. Compliant with medications. Does check Accu-Cheks daily.  Patient had cholecystectomy 1990, hysterectomy without oophorectomy 1981, left breast biopsy 1978, bladder biopsies for microscopic hematuria in 1980 04/22/1987 both of which were benign. 1996 she had fractured left ankle secondary to a fall which was a closed distal fibular shattered. History of recurrent low back pain related to lumbar disc disease. She is intolerant of codeine and says [facility] called for possible adverse reaction. She had colonoscopy in 2008.  Family history: Total of 6 brothers. One brother with history of hypertension on dialysis, one brother with diabetes mellitus, coronary disease and mental illness. One brother died of lung cancer. One sister died with diabetes mellitus heart and kidney failure. Recent death of sister as above. Daughter has HIV and is doing well.  Social history: She is a widow. Husband died of cirrhosis of the liver complications. One daughter with history of mental illness living in [place].     Review of Systems  Constitutional: Positive for fatigue.  HENT: Negative.   Eyes: Negative.   Respiratory: Negative.   Cardiovascular: Negative for chest pain and palpitations.    Gastrointestinal: Negative.   Genitourinary: Negative.        History of microscopic hematuria followed by urologist  Musculoskeletal: Positive for back pain.  Skin:       History of intertrigo under breast  Neurological: Negative.   Hematological: Negative.   Psychiatric/Behavioral:       Anxiety and grief reaction       Objective:   Physical Exam  Vitals reviewed. Constitutional: She is oriented to person, place, and time. She appears well-developed and well-nourished. No distress.  HENT:  Head: Normocephalic and atraumatic.  Right Ear: External ear normal.  Left Ear: External ear normal.  Mouth/Throat: No oropharyngeal exudate.  Eyes: Conjunctivae and EOM are normal. Pupils are equal, round, and reactive to light. Right eye exhibits no discharge. Left eye exhibits no discharge. No scleral icterus.  Neck: Neck supple. No JVD present. No thyromegaly present.       No carotid bruits  Cardiovascular: Normal rate, regular rhythm, normal heart sounds and intact distal pulses.   No murmur heard. Pulmonary/Chest: Effort normal and breath sounds normal. She has no wheezes. She has no rales.       Breasts normal female  Abdominal: Soft. Bowel sounds are normal. She exhibits no distension and no mass. There is no tenderness. There is no rebound and no guarding.  Genitourinary:       Deferred  Musculoskeletal: Normal range of motion. She exhibits no edema.  Lymphadenopathy:    She has no cervical adenopathy.  Neurological: She is alert and oriented to person, place, and time. She has normal reflexes. No cranial nerve deficit. Coordination normal.  Skin: Skin is warm and dry. No rash  noted. She is not diaphoretic.  Psychiatric: She has a normal mood and affect. Her behavior is normal. Judgment and thought content normal.   Subjective:   Patient presents for Medicare Annual/Subsequent preventive examination.   Review Past Medical/Family/Social: See above   Risk Factors  Current  exercise habits: Sedentary Dietary issues discussed: Low-fat low-carb diet  Cardiac risk factors:  Depression Screen  (Note: if answer to either of the following is "Yes", a more complete depression screening is indicated)   Over the past two weeks, have you felt down, depressed or hopeless? No  Over the past two weeks, have you felt little interest or pleasure in doing things? No Have you lost interest or pleasure in daily life? No Do you often feel hopeless? No Do you cry easily over simple problems? No   Activities of Daily Living  In your present state of health, do you have any difficulty performing the following activities?:   Driving? No  Managing money? No  Feeding yourself? No  Getting from bed to chair? No  Climbing a flight of stairs? No  Preparing food and eating?: No  Bathing or showering? No  Getting dressed: No  Getting to the toilet? No  Using the toilet:No  Moving around from place to place: No  In the past year have you fallen or had a near fall?:No  Are you sexually active? No  Do you have more than one partner? No   Hearing Difficulties: No  Do you often ask people to speak up or repeat themselves? No  Do you experience ringing or noises in your ears? No  Do you have difficulty understanding soft or whispered voices? No  Do you feel that you have a problem with memory? No Do you often misplace items? No    Home Safety:  Do you have a smoke alarm at your residence? Yes Do you have grab bars in the bathroom? No Do you have throw rugs in your house?yes   Cognitive Testing  Alert? Yes Normal Appearance?Yes  Oriented to person? Yes Place? Yes  Time? Yes  Recall of three objects? Yes  Can perform simple calculations? Yes  Displays appropriate judgment?Yes  Can read the correct time from a watch face?Yes   List the Names of Other Physician/Practitioners you currently use:  See referral list for the physicians patient is currently seeing. Urologist  for microscopic hematuria    Review of Systems:   Objective:     General appearance: Appears stated age and mildly obese  Head: Normocephalic, without obvious abnormality, atraumatic  Eyes: conj clear, EOMi PEERLA  Ears: normal TM's and external ear canals both ears  Nose: Nares normal. Septum midline. Mucosa normal. No drainage or sinus tenderness.  Throat: lips, mucosa, and tongue normal; teeth and gums normal  Neck: no adenopathy, no carotid bruit, no JVD, supple, symmetrical, trachea midline and thyroid not enlarged, symmetric, no tenderness/mass/nodules  No CVA tenderness.  Lungs: clear to auscultation bilaterally  Breasts: normal appearance, no masses or tenderness,  Heart: regular rate and rhythm, S1, S2 normal, no murmur, click, rub or gallop  Abdomen: soft, non-tender; bowel sounds normal; no masses, no organomegaly  Musculoskeletal: ROM normal in all joints, no crepitus, no deformity, Normal muscle strengthen. Back  is symmetric, no curvature. Skin: Skin color, texture, turgor normal. No rashes or lesions  Lymph nodes: Cervical, supraclavicular, and axillary nodes normal.  Neurologic: CN 2 -12 Normal, Normal symmetric reflexes. Normal coordination and gait  Psych: Alert & Oriented  x 3, Mood appear stable.    Assessment:    Annual wellness medicare exam   Plan:    During the course of the visit the patient was educated and counseled about appropriate screening and preventive services including:   Annual mammogram  Annual influenza immunization  Had Pneumovax immunization November 2011. Had Zostavax vaccine 11/10/2009. Had tetanus immunization November 2012  Had colonoscopy  Dec 31st 2008. Reminded regarding yearly eye exam. Last eye exam December 2012     Patient Instructions (the written plan) was given to the patient.  Medicare Attestation  I have personally reviewed:  The patient's medical and social history  Their use of alcohol, tobacco or illicit  drugs  Their current medications and supplements  The patient's functional ability including ADLs,fall risks, home safety risks, cognitive, and hearing and visual impairment  Diet and physical activities  Evidence for depression or mood disorders  The patient's weight, height, BMI, and visual acuity have been recorded in the chart. I have made referrals, counseling, and provided education to the patient based on review of the above and I have provided the patient with a written personalized care plan for preventive services.            Assessment & Plan:  Type 2 diabetes mellitus under good control. Continue daily Accu-Cheks. Recheck in 6 months. Last eye exam December 2012. Urine sent for microalbumin. Is on ACE inhibitor. Patient is on metformin.  History of microscopic hematuria with 2 benign biopsies followed by urologist. Urinalysis abnormal today consistent with microscopic hematuria  History of positive PPD  GE reflux-treated with PPI  Hyperlipidemia-on statin medication  History of adenomatous colon polyps he had colonoscopy 01/03/2007  History of vitamin D deficiency on over-the-counter vitamin D supplement  Obesity  History of herniated disc L5-S1 with recurrent back pain from time to time for which she takes Vicodin  Anxiety related to situational stress  Intertrigo under breast treated with Nizoral cream  Approximately 25 minutes spent reviewing all of her medical problems, getting medications refilled, also speaking with patient about managing situational stress

## 2011-09-05 NOTE — Patient Instructions (Addendum)
Continue same medications and return in 6 months for office visit and fasting lab work.

## 2011-11-14 ENCOUNTER — Ambulatory Visit (INDEPENDENT_AMBULATORY_CARE_PROVIDER_SITE_OTHER): Payer: Medicare Other | Admitting: Internal Medicine

## 2011-11-14 ENCOUNTER — Encounter: Payer: Self-pay | Admitting: Internal Medicine

## 2011-11-14 VITALS — BP 130/82 | HR 68 | Temp 98.0°F | Ht 60.75 in | Wt 199.0 lb

## 2011-11-14 DIAGNOSIS — L723 Sebaceous cyst: Secondary | ICD-10-CM

## 2011-11-14 DIAGNOSIS — L720 Epidermal cyst: Secondary | ICD-10-CM

## 2011-11-14 DIAGNOSIS — Z23 Encounter for immunization: Secondary | ICD-10-CM

## 2011-11-14 DIAGNOSIS — E119 Type 2 diabetes mellitus without complications: Secondary | ICD-10-CM

## 2011-11-14 DIAGNOSIS — H8110 Benign paroxysmal vertigo, unspecified ear: Secondary | ICD-10-CM

## 2011-11-14 DIAGNOSIS — I10 Essential (primary) hypertension: Secondary | ICD-10-CM

## 2011-11-14 DIAGNOSIS — H6690 Otitis media, unspecified, unspecified ear: Secondary | ICD-10-CM

## 2011-11-14 DIAGNOSIS — E785 Hyperlipidemia, unspecified: Secondary | ICD-10-CM

## 2011-11-14 DIAGNOSIS — R3129 Other microscopic hematuria: Secondary | ICD-10-CM

## 2011-11-14 DIAGNOSIS — H6691 Otitis media, unspecified, right ear: Secondary | ICD-10-CM

## 2011-11-14 DIAGNOSIS — Z79899 Other long term (current) drug therapy: Secondary | ICD-10-CM

## 2011-11-14 LAB — LIPID PANEL: Cholesterol: 149 mg/dL (ref 0–200)

## 2011-11-14 LAB — HEPATIC FUNCTION PANEL
ALT: 22 U/L (ref 0–35)
AST: 18 U/L (ref 0–37)
Albumin: 4 g/dL (ref 3.5–5.2)
Bilirubin, Direct: 0.1 mg/dL (ref 0.0–0.3)

## 2011-11-14 LAB — HEMOGLOBIN A1C: Hgb A1c MFr Bld: 6.9 % — ABNORMAL HIGH (ref <5.7)

## 2011-11-14 NOTE — Progress Notes (Signed)
  Subjective:    Patient ID: Leah Barron, female    DOB: [birth year], 71 y.o.   MRN: [id number]  HPI 71 year old Black female for 6 month recheck. Saw urologist recently for follow up of microscopic hematuria. Had cystoscopy which was negative and is to have CT of kidneys Nov 21st.  To see GYN soon re cyst on ovary (Dr. Cherly Hensen). About a month ago, had vertigo and residual right ear feeling stopped up. Fell on couch with episode of vertigo but did not get hurt. History of anxiety. Daughter has to have operation on leg and is worried. She helps her a lot. Staying with daughter since May 2013.   Hx of DM, HTN and hyperlipidemia. Fasting labs pending. Occasionally depressed for a day or so but not often.    Review of Systems     Objective:   Physical Exam Skin: epidermoid cyst on left anterior chest. Benign growth right chest. Chest clear; Cor:RRR; HEENT: left TM obscurred by cerumen and right TM dull but not red. Nasally congested. Neck supple. No JVD; Chest Clear; Cor RRR Ext without edema. Diabetic foot exam is normal without ulcers. Alert and oriented x3        Assessment & Plan:  Hypertension  Hyperlipidemia-labs pending-stable on current regimen-labs pending-treat with amoxicillin 500 mg 3 times daily for 10 days  Type 2 diabetes mellitus  Right otitis media  Microscopic hematuria-to have CT of kidneys in the near future  Recent episode of benign positional vertigo-likely aggravated by stress and ear infection  Plan: Return in 6 months for physical examination. Influenza immunization given today.

## 2011-11-14 NOTE — Patient Instructions (Addendum)
Take amoxicillin 500 mg 3 times daily for 10 days for ear infection. Continue same medications and return in 6 months for physical exam.

## 2011-11-17 ENCOUNTER — Telehealth: Payer: Self-pay | Admitting: Internal Medicine

## 2011-11-17 NOTE — Telephone Encounter (Signed)
Change to Zithromax Z pak take 2 tabs day 1 then one tab days 2-5. Use [facility]

## 2011-11-22 ENCOUNTER — Other Ambulatory Visit: Payer: Self-pay | Admitting: Internal Medicine

## 2012-01-11 ENCOUNTER — Other Ambulatory Visit: Payer: Self-pay | Admitting: Internal Medicine

## 2012-01-12 ENCOUNTER — Other Ambulatory Visit: Payer: Self-pay

## 2012-01-16 ENCOUNTER — Other Ambulatory Visit: Payer: Self-pay | Admitting: Gastroenterology

## 2012-02-01 ENCOUNTER — Other Ambulatory Visit: Payer: Self-pay | Admitting: Internal Medicine

## 2012-02-01 ENCOUNTER — Telehealth: Payer: Self-pay

## 2012-02-01 MED ORDER — AUTOLET IMPRESSION MISC: Status: DC

## 2012-02-01 MED ORDER — GLUCOSE BLOOD VI STRP: ORAL_STRIP | Status: DC

## 2012-02-01 MED ORDER — ACCU-CHEK AVIVA VI SOLN: Status: DC

## 2012-02-01 NOTE — Telephone Encounter (Signed)
[id number]

## 2012-02-02 ENCOUNTER — Encounter: Payer: Self-pay | Admitting: Internal Medicine

## 2012-02-02 ENCOUNTER — Ambulatory Visit (INDEPENDENT_AMBULATORY_CARE_PROVIDER_SITE_OTHER): Payer: Medicare Other | Admitting: Internal Medicine

## 2012-02-02 VITALS — BP 138/78 | HR 80 | Temp 99.1°F | Wt 196.0 lb

## 2012-02-02 DIAGNOSIS — E119 Type 2 diabetes mellitus without complications: Secondary | ICD-10-CM

## 2012-02-02 DIAGNOSIS — L02229 Furuncle of trunk, unspecified: Secondary | ICD-10-CM

## 2012-02-02 DIAGNOSIS — L538 Other specified erythematous conditions: Secondary | ICD-10-CM

## 2012-02-02 DIAGNOSIS — L304 Erythema intertrigo: Secondary | ICD-10-CM

## 2012-02-02 MED ORDER — METFORMIN HCL 500 MG PO TABS: 500.0000 mg | ORAL_TABLET | Freq: Two times a day (BID) | ORAL | Status: DC

## 2012-02-02 MED ORDER — DOXYCYCLINE HYCLATE 100 MG PO TABS: 100.0000 mg | ORAL_TABLET | Freq: Two times a day (BID) | ORAL | Status: DC

## 2012-02-02 MED ORDER — KETOCONAZOLE 2 % EX CREA: TOPICAL_CREAM | Freq: Every day | CUTANEOUS | Status: DC

## 2012-02-02 NOTE — Progress Notes (Signed)
  Subjective:    Patient ID: Leah Barron, female    DOB: 1940-02-03, 71 y.o.   MRN: [id number]  HPI Around Christmas Day 2013, had lesion right thumb that bled. Does not remember an injury or splinter. Treated with triple antibiotic ointment. Now has lesions on abdomen that started a couple of weeks after Christmas. Itchy and painful. Lesions open up and pus comes out. Also has intertrigo under breasts and under panniculus. Accuchecks has been high in am -- around 190. History of type 2 diabetes mellitus. Is on metformin once daily. Looking after daughter who has HIV.    Review of Systems     Objective:   Physical Exam no active infection right thumb. However anterior abdomen has multiple pustules that are small and erythematous. Also complaining of itchy area of mid back which looks like a patch of eczema. It has hyperpigmentation from chronic itching and scratching.        Assessment & Plan:  Type 2 diabetes mellitus. Has been on once daily metformin with high a.m. Accu-Cheks. This could be related to infection or poor diabetic control. Increase metformin to 500 mg twice daily before breakfast and before supper. This was gone over with her several times.  Carbuncles abdomen likely MRSA. Could very well have started from lesion right thumb. Prescribe doxycycline 100 mg twice daily for 10 days. Bath in Hibiclens daily for 2 months.  Intertrigo under breast and panniculus. Treat with Nizoral cream daily. Written prescription sent to [facility] with when necessary one year refills  25 minutes spent with patient going over how to treat all of these problems and explaining why they have occurred.

## 2012-02-02 NOTE — Patient Instructions (Addendum)
Take Doxycycline 100 mg twice daily  For 10 days. Use Nizoral cream under breast once a day. Bathe in Hibiclens daily for 2 months. Increasing Meformin to twice a day.

## 2012-02-03 MED ORDER — DOXYCYCLINE HYCLATE 100 MG PO TABS: 100.0000 mg | ORAL_TABLET | Freq: Two times a day (BID) | ORAL | Status: DC

## 2012-02-03 NOTE — Addendum Note (Signed)
Addended by: Judy Pimple on: 02/03/2012 09:55 AM   Modules accepted: Orders

## 2012-03-15 ENCOUNTER — Other Ambulatory Visit: Payer: Self-pay | Admitting: Internal Medicine

## 2012-05-22 ENCOUNTER — Encounter: Payer: Medicare Other | Admitting: Internal Medicine

## 2012-07-03 ENCOUNTER — Ambulatory Visit (INDEPENDENT_AMBULATORY_CARE_PROVIDER_SITE_OTHER): Payer: Medicare Other | Admitting: Internal Medicine

## 2012-07-03 ENCOUNTER — Encounter: Payer: Self-pay | Admitting: Internal Medicine

## 2012-07-03 ENCOUNTER — Telehealth: Payer: Self-pay | Admitting: Internal Medicine

## 2012-07-03 VITALS — BP 126/74 | HR 72 | Temp 99.4°F | Ht 61.0 in | Wt 192.0 lb

## 2012-07-03 DIAGNOSIS — Z1329 Encounter for screening for other suspected endocrine disorder: Secondary | ICD-10-CM

## 2012-07-03 DIAGNOSIS — M545 Low back pain, unspecified: Secondary | ICD-10-CM

## 2012-07-03 DIAGNOSIS — K219 Gastro-esophageal reflux disease without esophagitis: Secondary | ICD-10-CM

## 2012-07-03 DIAGNOSIS — R3129 Other microscopic hematuria: Secondary | ICD-10-CM

## 2012-07-03 DIAGNOSIS — E559 Vitamin D deficiency, unspecified: Secondary | ICD-10-CM

## 2012-07-03 DIAGNOSIS — Z0000 Encounter for general adult medical examination without abnormal findings: Secondary | ICD-10-CM

## 2012-07-03 DIAGNOSIS — L538 Other specified erythematous conditions: Secondary | ICD-10-CM

## 2012-07-03 DIAGNOSIS — E782 Mixed hyperlipidemia: Secondary | ICD-10-CM

## 2012-07-03 DIAGNOSIS — E119 Type 2 diabetes mellitus without complications: Secondary | ICD-10-CM

## 2012-07-03 DIAGNOSIS — Z130 Encounter for screening for diseases of the blood and blood-forming organs and certain disorders involving the immune mechanism: Secondary | ICD-10-CM

## 2012-07-03 DIAGNOSIS — E8881 Metabolic syndrome: Secondary | ICD-10-CM

## 2012-07-03 DIAGNOSIS — L304 Erythema intertrigo: Secondary | ICD-10-CM

## 2012-07-03 DIAGNOSIS — Z79899 Other long term (current) drug therapy: Secondary | ICD-10-CM

## 2012-07-03 DIAGNOSIS — F411 Generalized anxiety disorder: Secondary | ICD-10-CM

## 2012-07-03 LAB — CBC WITH DIFFERENTIAL/PLATELET
Basophils Absolute: 0 10*3/uL (ref 0.0–0.1)
Eosinophils Relative: 1 % (ref 0–5)
HCT: 37.3 % (ref 36.0–46.0)
Lymphocytes Relative: 45 % (ref 12–46)
Lymphs Abs: 2.5 10*3/uL (ref 0.7–4.0)
MCV: 88.2 fL (ref 78.0–100.0)
Monocytes Absolute: 0.3 10*3/uL (ref 0.1–1.0)
Neutro Abs: 2.6 10*3/uL (ref 1.7–7.7)
RBC: 4.23 MIL/uL (ref 3.87–5.11)
WBC: 5.5 10*3/uL (ref 4.0–10.5)

## 2012-07-03 LAB — COMPREHENSIVE METABOLIC PANEL
ALT: 16 U/L (ref 0–35)
AST: 15 U/L (ref 0–37)
Albumin: 3.9 g/dL (ref 3.5–5.2)
CO2: 29 mEq/L (ref 19–32)
Calcium: 9.9 mg/dL (ref 8.4–10.5)
Chloride: 106 mEq/L (ref 96–112)
Creat: 0.93 mg/dL (ref 0.50–1.10)
Potassium: 3.7 mEq/L (ref 3.5–5.3)

## 2012-07-03 LAB — LIPID PANEL
Cholesterol: 142 mg/dL (ref 0–200)
Total CHOL/HDL Ratio: 2.8 Ratio

## 2012-07-03 LAB — POCT URINALYSIS DIPSTICK
Bilirubin, UA: NEGATIVE
Glucose, UA: NEGATIVE
Ketones, UA: NEGATIVE
Leukocytes, UA: NEGATIVE
Spec Grav, UA: 1.02

## 2012-07-03 LAB — TSH: TSH: 0.758 u[IU]/mL (ref 0.350–4.500)

## 2012-07-03 LAB — HEMOGLOBIN A1C
Hgb A1c MFr Bld: 6.4 % — ABNORMAL HIGH (ref <5.7)
Mean Plasma Glucose: 137 mg/dL — ABNORMAL HIGH (ref <117)

## 2012-07-03 MED ORDER — HYDROCODONE-ACETAMINOPHEN 5-325 MG PO TABS: 1.0000 | ORAL_TABLET | Freq: Four times a day (QID) | ORAL | Status: DC | PRN

## 2012-07-03 MED ORDER — MUPIROCIN 2 % EX OINT: TOPICAL_OINTMENT | Freq: Three times a day (TID) | CUTANEOUS | Status: DC

## 2012-07-03 NOTE — Telephone Encounter (Signed)
Fax [phone].  Send copy of insurance card, today's note and a list of patient's medications as well.

## 2012-07-03 NOTE — Telephone Encounter (Signed)
See note for info to go to Dr. Haroldine Laws

## 2012-07-03 NOTE — Progress Notes (Signed)
Subjective:    Patient ID: Leah Barron, female    DOB: Aug 03, 1940, 72 y.o.   MRN: [id number]  HPI  72 year old Black female for health maintenance and evaluation of medical problems. She has a history of type 2 diabetes mellitus, metabolic syndrome, obesity, hyperlipidemia, microscopic hematuria previously evaluated by urologist, GE reflux, history of adenomatous colon polyps, lumbar disc herniation L5-S1, vitamin D deficiency, anxiety. Patient is compliant with medications and checks Accu-Cheks daily.  Had cholecystectomy 1990, hysterectomy without oophorectomy 1981, left breast biopsy 1978, bladder biopsies for microendoscopic hematuria in 1980 and 1989 both of which were benign. In 1996, she suffered a left ankle fracture secondary to a fall. History of recurrent low back pain related to lumbar disc disease.  She is intolerant of codeine and says Parafon Forte today caused a possible adverse reaction.  Had colonoscopy in 2008.  Social history: She is a widow. Husband died of complications of cirrhosis of the liver. One daughter with history of mental illness living in [place]. One daughter lives here with history of HIV. She has been living with daughter with HIV taking care of her.  Family history: Total of 6 brothers. One brother with history of hypertension and kidney failure. One brother with diabetes mellitus, coronary artery disease, mental illness. One brother died of lung cancer. One sister died with diabetes mellitus, heart and kidney failure. One sister died of cardiac arrest.  Patient does not smoke or consume alcohol. She is a Production assistant, radio.    Review of Systems  Constitutional: Positive for fatigue.  Eyes:       History of cataract surgery  Cardiovascular: Negative.   Gastrointestinal: Negative.   Genitourinary:       History of benign microscopic hematuria  Skin:       History of intertrigo under breast  Psychiatric/Behavioral:       History of anxiety       Objective:   Physical Exam  Vitals reviewed. Constitutional: She is oriented to person, place, and time. She appears well-developed and well-nourished. No distress.  HENT:  Head: Normocephalic and atraumatic.  Right Ear: External ear normal.  Left Ear: External ear normal.  Mouth/Throat: Oropharynx is clear and moist. No oropharyngeal exudate.  Eyes: Conjunctivae and EOM are normal. Pupils are equal, round, and reactive to light. Left eye exhibits no discharge. No scleral icterus.  Neck: Normal range of motion. Neck supple. No JVD present. No tracheal deviation present. No thyromegaly present.  Cardiovascular: Normal rate, regular rhythm, normal heart sounds and intact distal pulses.   No murmur heard. Pulmonary/Chest: Effort normal and breath sounds normal. No respiratory distress. She has no wheezes. She has no rales.  Breasts normal female  Abdominal: Soft. Bowel sounds are normal. She exhibits no distension and no mass. There is no rebound and no guarding.  Genitourinary:  Deferred.  Musculoskeletal: Normal range of motion. She exhibits no edema.  Lymphadenopathy:    She has no cervical adenopathy.  Neurological: She is alert and oriented to person, place, and time. She has normal reflexes. No cranial nerve deficit. Coordination normal.  Skin: Skin is warm and dry. No rash noted. She is not diaphoretic.  Psychiatric: She has a normal mood and affect. Her behavior is normal. Judgment and thought content normal.          Assessment & Plan:  Controlled type 2 diabetes mellitus  Hyperlipidemia  History of vitamin D deficiency  History of anxiety  Intertrigo  Remote history  of positive PPD  GE reflux  Lumbar disc disease  Benign microscopic hematuria  History of adenomatous colon polyps  Plan: Return in 6 months for office visit, hemoglobin A1c, blood pressure check, lipid panel. Continue same medications.  Subjective:   Patient presents for Medicare  Annual/Subsequent preventive examination.   Review Past Medical/Family/Social:   Risk Factors  Current exercise habits: sedentary Dietary issues discussed: low fat low carb  Cardiac risk factors:  Depression Screen  (Note: if answer to either of the following is "Yes", a more complete depression screening is indicated)   Over the past two weeks, have you felt down, depressed or hopeless? No  Over the past two weeks, have you felt little interest or pleasure in doing things? No Have you lost interest or pleasure in daily life? No Do you often feel hopeless? No Do you cry easily over simple problems? No   Activities of Daily Living  In your present state of health, do you have any difficulty performing the following activities?:   Driving? No  Managing money? No  Feeding yourself? No  Getting from bed to chair? No  Climbing a flight of stairs? No  Preparing food and eating?: No  Bathing or showering? No  Getting dressed: No  Getting to the toilet? No  Using the toilet:No  Moving around from place to place: No  In the past year have you fallen or had a near fall?:No  Are you sexually active? No  Do you have more than one partner? No   Hearing Difficulties: No  Do you often ask people to speak up or repeat themselves? Yes-has wax Do you experience ringing or noises in your ears? No  Do you have difficulty understanding soft or whispered voices? yes Do you feel that you have a problem with memory? No Do you often misplace items? No    Home Safety:  Do you have a smoke alarm at your residence? Yes Do you have grab bars in the bathroom? -no Do you have throw rugs in your house?-yes only one but not slippery   Cognitive Testing  Alert? Yes Normal Appearance?Yes  Oriented to person? Yes Place? Yes  Time? Yes  Recall of three objects? Yes  Can perform simple calculations? Yes  Displays appropriate judgment?Yes  Can read the correct time from a watch face?Yes   List  the Names of Other Physician/Practitioners you currently use:  See referral list for the physicians patient is currently seeing. Dr. Perrin Smack    Review of Systems:   Objective:     General appearance: Appears stated age and mildly obese  Head: Normocephalic, without obvious abnormality, atraumatic  Eyes: conj clear, EOMi PEERLA  Ears: normal TM's and external ear canals both ears  Nose: Nares normal. Septum midline. Mucosa normal. No drainage or sinus tenderness.  Throat: lips, mucosa, and tongue normal; teeth and gums normal  Neck: no adenopathy, no carotid bruit, no JVD, supple, symmetrical, trachea midline and thyroid not enlarged, symmetric, no tenderness/mass/nodules  No CVA tenderness.  Lungs: clear to auscultation bilaterally  Breasts: normal appearance, no masses or tenderness. Heart: regular rate and rhythm, S1, S2 normal, no murmur, click, rub or gallop  Abdomen: soft, non-tender; bowel sounds normal; no masses, no organomegaly  Musculoskeletal: ROM normal in all joints, no crepitus, no deformity, Normal muscle strengthen. Back  is symmetric, no curvature. Skin: Skin color, texture, turgor normal. No rashes or lesions  Lymph nodes: Cervical, supraclavicular, and axillary nodes normal.  Neurologic: CN  2 -12 Normal, Normal symmetric reflexes. Normal coordination and gait  Psych: Alert & Oriented x 3, Mood appear stable.    Assessment:    Annual wellness medicare exam   Plan:    During the course of the visit the patient was educated and counseled about appropriate screening and preventive services including:   Bone density Mammogram Colonoscopy done     Patient Instructions (the written plan) was given to the patient.  Medicare Attestation  I have personally reviewed:  The patient's medical and social history  Their use of alcohol, tobacco or illicit drugs  Their current medications and supplements  The patient's functional ability including ADLs,fall  risks, home safety risks, cognitive, and hearing and visual impairment  Diet and physical activities  Evidence for depression or mood disorders  The patient's weight, height, BMI, and visual acuity have been recorded in the chart. I have made referrals, counseling, and provided education to the patient based on review of the above and I have provided the patient with a written personalized care plan for preventive services.

## 2012-07-04 LAB — VITAMIN D 25 HYDROXY (VIT D DEFICIENCY, FRACTURES): Vit D, 25-Hydroxy: 45 ng/mL (ref 30–89)

## 2012-07-05 ENCOUNTER — Other Ambulatory Visit: Payer: Self-pay

## 2012-07-05 DIAGNOSIS — E119 Type 2 diabetes mellitus without complications: Secondary | ICD-10-CM

## 2012-07-05 MED ORDER — METFORMIN HCL 500 MG PO TABS: 500.0000 mg | ORAL_TABLET | Freq: Two times a day (BID) | ORAL | Status: DC

## 2012-07-14 ENCOUNTER — Emergency Department (HOSPITAL_COMMUNITY)
Admission: EM | Admit: 2012-07-14 | Discharge: 2012-07-14 | Disposition: A | Discharge status: Home / Self Care | Payer: Medicare Other | Attending: Emergency Medicine | Admitting: Emergency Medicine

## 2012-07-14 ENCOUNTER — Encounter (HOSPITAL_COMMUNITY): Payer: Self-pay | Admitting: Emergency Medicine

## 2012-07-14 DIAGNOSIS — L708 Other acne: Secondary | ICD-10-CM | POA: Insufficient documentation

## 2012-07-14 DIAGNOSIS — IMO0002 Reserved for concepts with insufficient information to code with codable children: Secondary | ICD-10-CM | POA: Insufficient documentation

## 2012-07-14 DIAGNOSIS — R21 Rash and other nonspecific skin eruption: Secondary | ICD-10-CM | POA: Insufficient documentation

## 2012-07-14 DIAGNOSIS — K219 Gastro-esophageal reflux disease without esophagitis: Secondary | ICD-10-CM | POA: Insufficient documentation

## 2012-07-14 DIAGNOSIS — L03319 Cellulitis of trunk, unspecified: Secondary | ICD-10-CM | POA: Insufficient documentation

## 2012-07-14 DIAGNOSIS — H269 Unspecified cataract: Secondary | ICD-10-CM | POA: Insufficient documentation

## 2012-07-14 DIAGNOSIS — L700 Acne vulgaris: Secondary | ICD-10-CM

## 2012-07-14 DIAGNOSIS — I10 Essential (primary) hypertension: Secondary | ICD-10-CM | POA: Insufficient documentation

## 2012-07-14 DIAGNOSIS — Z79899 Other long term (current) drug therapy: Secondary | ICD-10-CM | POA: Insufficient documentation

## 2012-07-14 DIAGNOSIS — Z87448 Personal history of other diseases of urinary system: Secondary | ICD-10-CM | POA: Insufficient documentation

## 2012-07-14 DIAGNOSIS — L02219 Cutaneous abscess of trunk, unspecified: Secondary | ICD-10-CM | POA: Insufficient documentation

## 2012-07-14 DIAGNOSIS — E119 Type 2 diabetes mellitus without complications: Secondary | ICD-10-CM | POA: Insufficient documentation

## 2012-07-14 DIAGNOSIS — E785 Hyperlipidemia, unspecified: Secondary | ICD-10-CM | POA: Insufficient documentation

## 2012-07-14 DIAGNOSIS — F411 Generalized anxiety disorder: Secondary | ICD-10-CM | POA: Insufficient documentation

## 2012-07-14 DIAGNOSIS — Z87440 Personal history of urinary (tract) infections: Secondary | ICD-10-CM | POA: Insufficient documentation

## 2012-07-14 DIAGNOSIS — L02213 Cutaneous abscess of chest wall: Secondary | ICD-10-CM

## 2012-07-14 DIAGNOSIS — Z8639 Personal history of other endocrine, nutritional and metabolic disease: Secondary | ICD-10-CM | POA: Insufficient documentation

## 2012-07-14 DIAGNOSIS — Z7982 Long term (current) use of aspirin: Secondary | ICD-10-CM | POA: Insufficient documentation

## 2012-07-14 MED ORDER — TRAMADOL HCL 50 MG PO TABS: 50.0000 mg | ORAL_TABLET | Freq: Four times a day (QID) | ORAL | Status: DC | PRN

## 2012-07-14 MED ORDER — TRAMADOL HCL 50 MG PO TABS
50.0000 mg | ORAL_TABLET | Freq: Once | ORAL | Status: AC
  Administered 2012-07-14: 50 mg via ORAL
  Filled 2012-07-14: qty 1

## 2012-07-14 NOTE — ED Provider Notes (Signed)
History    CSN: [id number] Arrival date & time 07/14/12  2130  First MD Initiated Contact with Patient 07/14/12 [phone]     Chief Complaint  Patient presents with  . Abscess   (Consider location/radiation/quality/duration/timing/severity/associated sxs/prior Treatment) HPI  72 year old female with history of insulin dependent diabetes presents for evaluations of suspected abscess.  Pt had a comedo (blackhead) on her L upper chest for many years that never bothers her.  She was seen by dermatology in march but was told no intervention necessary.  However in the past 4-5 days she notices increasing swelling, pain, warmth and redness to the site.  Pain is becoming progressively worse. She also notice a scant amount of drainage from the site. She is scheduled to be seen by dermatologist on Wednesday but unable to bear the pain. She has tried using mupirocin cream, warm compress, alcohol, and peroxide as treatment with minimal relief. Denies any fever, or shortness of breath. No recent trauma.  Past Medical History  Diagnosis Date  . Diabetes mellitus   . Hyperlipidemia   . Hypertension   . Allergy   . GERD (gastroesophageal reflux disease)   . Hematuria, microscopic   . Positive TB test   . Vitamin D deficiency   . Anxiety   . Cataract   . History of recurrent UTIs 05/11/2010   Past Surgical History  Procedure Laterality Date  . Cholecystectomy    . Abdominal hysterectomy    . Breast surgery  1978    l breast biopsy  . Cystostomy w/ bladder biopsy  1984, 1989   Family History  Problem Relation Age of Onset  . Dementia Mother   . Diabetes Father   . Diabetes Sister   . Heart disease Sister   . Diabetes Brother   . Kidney disease Brother   . HIV Daughter    History  Substance Use Topics  . Smoking status: Never Smoker   . Smokeless tobacco: Never Used  . Alcohol Use: No   OB History   Grav Para Term Preterm Abortions TAB SAB Ect Mult Living                 Review of  Systems  Constitutional: Negative for fever.  Cardiovascular: Positive for chest pain (chest wall pain).  Skin: Positive for rash and wound.    Allergies  Chlorzoxazone and Codeine  Home Medications   Current Outpatient Rx  Name  Route  Sig  Dispense  Refill  . Alpha-Lipoic Acid 200 MG CAPS   Oral   Take by mouth daily.           Marland Kitchen ALPRAZolam (XANAX) 0.5 MG tablet      TAKE ONE TABLET BY MOUTH TWICE DAILY AS NEEDED   60 tablet   4   . aspirin 81 MG tablet   Oral   Take 81 mg by mouth daily.         Marland Kitchen atorvastatin (LIPITOR) 20 MG tablet   Oral   Take 20 mg by mouth daily. 1/2 tablet daily          . Blood Glucose Calibration (ACCU-CHEK AVIVA) SOLN      As directed   1 each   3   . Cholecalciferol 2000 UNITS CAPS   Oral   Take 2,000 Units by mouth 1 dose over 46 hours.           . clotrimazole-betamethasone (LOTRISONE) cream      USE ON RASH TWICE  DAILY   45 g   PRN   . Cyanocobalamin (VITAMIN B 12 PO)   Oral   Take 500 mg by mouth daily.           Marland Kitchen doxycycline (VIBRA-TABS) 100 MG tablet   Oral   Take 1 tablet (100 mg total) by mouth 2 (two) times daily.   20 tablet   0   . fish oil-omega-3 fatty acids 1000 MG capsule   Oral   Take 2 g by mouth daily.           . Garlic 1000 MG CAPS   Oral   Take by mouth daily.           Marland Kitchen glucose blood (ACCU-CHEK AVIVA PLUS) test strip      Use as instructed   100 each   3   . hydrochlorothiazide (HYDRODIURIL) 25 MG tablet      TAKE ONE TABLET BY MOUTH EVERY DAY   90 tablet   PRN   . HYDROcodone-acetaminophen (NORCO) 5-325 MG per tablet   Oral   Take 1 tablet by mouth every 6 (six) hours as needed for pain. One every 12 hours prn pain   60 tablet   3   . ketoconazole (NIZORAL) 2 % cream   Topical   Apply topically daily. Use under breasts daily prn   60 g   6   . Lancet Devices (AUTOLET IMPRESSION) MISC      As directed   1 each   1   . metFORMIN (GLUCOPHAGE) 500 MG tablet    Oral   Take 1 tablet (500 mg total) by mouth 2 (two) times daily with a meal.   60 tablet   5   . Multiple Vitamin (MULTIVITAMIN) tablet   Oral   Take 1 tablet by mouth daily.           . mupirocin ointment (BACTROBAN) 2 %   Topical   Apply topically 3 (three) times daily.   22 g   1   . omeprazole (PRILOSEC) 40 MG capsule      TAKE ONE CAPSULE BY MOUTH EVERY DAY   30 capsule   6   . ramipril (ALTACE) 2.5 MG capsule   Oral   Take 1 capsule (2.5 mg total) by mouth daily.   90 capsule   3   . Ultilet Classic Lancets MISC      Test once a day   100 each   1    BP 144/82  Pulse 73  Temp(Src) 98.2 F (36.8 C) (Oral)  Resp 18  SpO2 100% Physical Exam  Nursing note and vitals reviewed. Constitutional: She appears well-developed and well-nourished. No distress.  HENT:  Head: Atraumatic.  Eyes: Conjunctivae are normal.  Neck: Neck supple.  Cardiovascular: Normal rate and regular rhythm.   Pulmonary/Chest: Effort normal. She exhibits tenderness (L upper chest with evidence of comedone with surrounding induration and fluctuance, mild erythema noted  to surrounding tissue, ttp.  ).  Musculoskeletal: Normal range of motion.  Neurological: She is alert.    ED Course  Procedures (including critical care time)  10:17 AM Pt with infected comedone, with successful I&D.  Care instruction provided.    INCISION AND DRAINAGE Performed by: Fayrene Helper Consent: Verbal consent obtained. Risks and benefits: risks, benefits and alternatives were discussed Type: abscess  Body area: L upper chest  Anesthesia: local infiltration  Incision was made with a scalpel.  Local anesthetic: lidocaine 2% 2  epinephrine  Anesthetic total: 3 ml  Complexity: complex Blunt dissection to break up loculations  Drainage: purulent  Drainage amount: moderate  Packing material: 1/4 in iodoform gauze  Patient tolerance: Patient tolerated the procedure well with no immediate  complications.     Labs Reviewed - No data to display No results found. 1. Comedone   2. Cutaneous abscess of chest wall     MDM  BP 144/82  Pulse 73  Temp(Src) 98.2 F (36.8 C) (Oral)  Resp 18  SpO2 100%   Fayrene Helper, PA-C 07/14/12 1021

## 2012-07-14 NOTE — ED Provider Notes (Signed)
Medical screening examination/treatment/procedure(s) were performed by non-physician practitioner and as supervising physician I was immediately available for consultation/collaboration.  Flint Melter, MD 07/14/12 2047

## 2012-07-14 NOTE — ED Notes (Signed)
Dressing applied to patient's left chest. Patient tolerated without incident.  Patient requested pain medication now because it will be 3-4 hours until she is able to get her prescription filled. PA notified.

## 2012-07-14 NOTE — ED Notes (Signed)
Onset 4-5 days ago left upper chest size of a marble light pink and red with white scant drainage. Patient states spoke with dermatologist and unable to see the Doctor until Wednesday. Pain worsening overtime.

## 2012-07-16 ENCOUNTER — Ambulatory Visit (INDEPENDENT_AMBULATORY_CARE_PROVIDER_SITE_OTHER): Payer: Medicare Other | Admitting: Internal Medicine

## 2012-07-16 ENCOUNTER — Telehealth: Payer: Self-pay | Admitting: Internal Medicine

## 2012-07-16 ENCOUNTER — Encounter: Payer: Self-pay | Admitting: Internal Medicine

## 2012-07-16 VITALS — BP 138/70 | HR 76 | Temp 98.8°F | Wt 192.0 lb

## 2012-07-16 DIAGNOSIS — L089 Local infection of the skin and subcutaneous tissue, unspecified: Secondary | ICD-10-CM

## 2012-07-16 DIAGNOSIS — L723 Sebaceous cyst: Secondary | ICD-10-CM

## 2012-07-16 NOTE — Telephone Encounter (Signed)
Spoke with patient; advise to NOT remove the packing and only take pain meds if absolutely needed.   Gave appointment for TODAY at 4pm.  Patient also instructed to keep appointment with the specialist.  Patient verbalizes understanding of this information/instruction.

## 2012-07-16 NOTE — Progress Notes (Addendum)
  Subjective:    Patient ID: Leah Barron, female    DOB: 1940-07-29, 72 y.o.   MRN: [id number]  HPI  Pt was seen in ED on July 12 with what sounds like an infected Epidermoid cyst left anterior chest which was  Incised and Drained. She was given Tramadol for ain but says it is causing her to itch. Wound was packed. She currently is not on antibiotics.    Review of Systems     Objective:   Physical Exam Incision with packing in place left anterior chest        Assessment & Plan:  Infected Epidermoid Cyst- left anterior chest Plan: Pt has Norco for back pain and may take that in place of Tramadol for pain. Keflex 500 mg qid x 10 days. Appointment  tomorow to see surgeon regarding excision of cyst wall/sac in near future. Time spent with patient 15 minutes

## 2012-07-16 NOTE — Patient Instructions (Addendum)
Patient scheduled for an appointment with Dr. Corliss Skains on Tuesday 07/17/2012 at 2:45 pm. She and her daughter given this info. Take Keflex 500 mg 4 times daily for infection and take Norco q 8 hours as needed for pain

## 2012-07-16 NOTE — Telephone Encounter (Signed)
I am not sure what directions she received in ED about packing.   Take pain meds only if needed. This is a cyst that needs to be excised,  as it became infected.  I can see her this afternoon but she still needs to see Specialist

## 2012-07-16 NOTE — Telephone Encounter (Signed)
Leah Barron is scheduled to see her on Wednesday of this week.  Should she just follow up with Dr. Charlton Barron?  Or, do you want to see her?  Also she wants to know if she can take the packing out?  Patient states she was given pain medication in the ED.  Please advise.  Thanks.

## 2012-07-17 ENCOUNTER — Encounter (INDEPENDENT_AMBULATORY_CARE_PROVIDER_SITE_OTHER): Payer: Self-pay | Admitting: Surgery

## 2012-07-17 ENCOUNTER — Ambulatory Visit (INDEPENDENT_AMBULATORY_CARE_PROVIDER_SITE_OTHER): Payer: Medicare Other | Admitting: Surgery

## 2012-07-17 VITALS — BP 150/100 | HR 80 | Temp 98.2°F | Resp 17 | Ht 61.0 in | Wt 193.6 lb

## 2012-07-17 DIAGNOSIS — L723 Sebaceous cyst: Secondary | ICD-10-CM

## 2012-07-17 DIAGNOSIS — L089 Local infection of the skin and subcutaneous tissue, unspecified: Secondary | ICD-10-CM | POA: Insufficient documentation

## 2012-07-17 MED ORDER — HYDROCODONE-ACETAMINOPHEN 5-325 MG PO TABS: 1.0000 | ORAL_TABLET | Freq: Four times a day (QID) | ORAL | Status: DC | PRN

## 2012-07-17 NOTE — Progress Notes (Signed)
Patient ID: Leah Barron, female   DOB: [birth year], 72 y.o.   MRN: [id number]  Chief Complaint  Patient presents with  . New Evaluation    lft chest cyst / infection    HPI Leah Barron is a 72 y.o. female.  Referred by Dr. Lenord Fellers for evaluation of left chest wall cyst HPI This is a 72 year old female with multiple medical comorbidities who is today status post incision and drainage of a large infected sebaceous cyst of her left chest wall. This was done in the emergency department by an ED PA.  The wound was packed. The patient was seen by Dr. Lenord Fellers yesterday who referred her for evaluation. She is seen in the urgent office today. This area remains very tender. The packing from the emergency department remains in place. Past Medical History  Diagnosis Date  . Diabetes mellitus   . Hyperlipidemia   . Hypertension   . Allergy   . GERD (gastroesophageal reflux disease)   . Hematuria, microscopic   . Positive TB test   . Vitamin D deficiency   . Anxiety   . Cataract   . History of recurrent UTIs 05/11/2010    Past Surgical History  Procedure Laterality Date  . Cholecystectomy    . Abdominal hysterectomy    . Breast surgery  1978    l breast biopsy  . Cystostomy w/ bladder biopsy  1984, 1989    Family History  Problem Relation Age of Onset  . Dementia Mother   . Diabetes Father   . Diabetes Sister   . Heart disease Sister   . Diabetes Brother   . Kidney disease Brother   . HIV Daughter     Social History History  Substance Use Topics  . Smoking status: Never Smoker   . Smokeless tobacco: Never Used  . Alcohol Use: No    Allergies  Allergen Reactions  . Chlorzoxazone     Unknown reaction  . Codeine Nausea Only    Current Outpatient Prescriptions  Medication Sig Dispense Refill  . ALPRAZolam (XANAX) 0.5 MG tablet TAKE ONE TABLET BY MOUTH TWICE DAILY AS NEEDED  60 tablet  4  . aspirin 81 MG tablet Take 81 mg by mouth daily.      Marland Kitchen atorvastatin (LIPITOR)  20 MG tablet Take 10 mg by mouth daily. 1/2 tablet daily      . Blood Glucose Calibration (ACCU-CHEK AVIVA) SOLN As directed  1 each  3  . cephALEXin (KEFLEX) 500 MG capsule       . Cholecalciferol 2000 UNITS CAPS Take 2,000 Units by mouth 1 dose over 46 hours.        . Cyanocobalamin (VITAMIN B 12 PO) Take 500 mg by mouth daily.        . fish oil-omega-3 fatty acids 1000 MG capsule Take 2 g by mouth daily.       Marland Kitchen glucose blood (ACCU-CHEK AVIVA PLUS) test strip Use as instructed  100 each  3  . hydrochlorothiazide (HYDRODIURIL) 25 MG tablet TAKE ONE TABLET BY MOUTH EVERY DAY  90 tablet  PRN  . HYDROcodone-acetaminophen (NORCO) 5-325 MG per tablet Take 1 tablet by mouth every 6 (six) hours as needed for pain. One every 12 hours prn pain  40 tablet  0  . ketoconazole (NIZORAL) 2 % cream Apply topically daily. Use under breasts daily prn  60 g  6  . Lancet Devices (AUTOLET IMPRESSION) MISC As directed  1 each  1  .  loratadine (CLARITIN) 10 MG tablet Take 10 mg by mouth daily.      . metFORMIN (GLUCOPHAGE) 500 MG tablet Take 1 tablet (500 mg total) by mouth 2 (two) times daily with a meal.  60 tablet  5  . Multiple Vitamin (MULTIVITAMIN) tablet Take 1 tablet by mouth daily.        . mupirocin ointment (BACTROBAN) 2 % Apply topically 3 (three) times daily.  22 g  1  . omeprazole (PRILOSEC) 40 MG capsule TAKE ONE CAPSULE BY MOUTH EVERY DAY  30 capsule  6  . ramipril (ALTACE) 2.5 MG capsule Take 1 capsule (2.5 mg total) by mouth daily.  90 capsule  3  . traMADol (ULTRAM) 50 MG tablet Take 1 tablet (50 mg total) by mouth every 6 (six) hours as needed for pain.  15 tablet  0  . Ultilet Classic Lancets MISC Test once a day  100 each  1   Current Facility-Administered Medications  Medication Dose Route Frequency Provider Last Rate Last Dose  . TDaP (BOOSTRIX) injection 0.5 mL  0.5 mL Intramuscular Once Margaree Mackintosh, MD        Review of Systems Review of Systems  Blood pressure 150/100, pulse 80,  temperature 98.2 F (36.8 C), temperature source Temporal, resp. rate 17, height 5\' 1"  (1.549 m), weight 193 lb 9.6 oz (87.816 kg).  Physical Exam Physical Exam WDWN in NAD Left chest wall - subclavicular - 1 cm open wound - packing removed There seems to be about 2 cm of remaining cyst under the skin.   Data Reviewed   Assessment    Infected sebaceous cyst - left chest - incompletely excised     Plan    We prepped the area with Betadine and anesthetized with 1% lidocaine. I extended the incision slightly to include the opening of the sebaceous cyst. We then were able to remove the entire cyst wall as well as a large amount of infected sebum. The wound was packed with Nu Gauze. A dry dressing was applied. She is to remove the dressing in 2 days and may treat the wound with Neosporin after that time. She should keep the wound covered with daily dressing changes until it is healed.        Kymoni Lesperance K. 07/17/2012, 4:01 PM

## 2012-07-18 ENCOUNTER — Other Ambulatory Visit: Payer: Self-pay | Admitting: Internal Medicine

## 2012-07-19 ENCOUNTER — Other Ambulatory Visit: Payer: Self-pay

## 2012-07-19 MED ORDER — ALPRAZOLAM 0.5 MG PO TABS: 0.5000 mg | ORAL_TABLET | Freq: Two times a day (BID) | ORAL | Status: DC | PRN

## 2012-07-19 NOTE — Telephone Encounter (Signed)
Please refill x 6 months 

## 2012-07-20 ENCOUNTER — Other Ambulatory Visit: Payer: Self-pay | Admitting: Internal Medicine

## 2012-07-24 ENCOUNTER — Telehealth (INDEPENDENT_AMBULATORY_CARE_PROVIDER_SITE_OTHER): Payer: Self-pay | Admitting: General Surgery

## 2012-07-24 ENCOUNTER — Encounter (INDEPENDENT_AMBULATORY_CARE_PROVIDER_SITE_OTHER): Payer: Medicare Other | Admitting: Surgery

## 2012-07-24 NOTE — Telephone Encounter (Signed)
LMOM for patient to call back and ask for [facility] patient need to be R/S

## 2012-07-26 ENCOUNTER — Ambulatory Visit (INDEPENDENT_AMBULATORY_CARE_PROVIDER_SITE_OTHER): Payer: Medicare Other | Admitting: Surgery

## 2012-07-26 ENCOUNTER — Encounter (INDEPENDENT_AMBULATORY_CARE_PROVIDER_SITE_OTHER): Payer: Self-pay | Admitting: Surgery

## 2012-07-26 VITALS — BP 128/78 | HR 64 | Temp 97.5°F | Resp 14 | Ht 61.0 in | Wt 192.2 lb

## 2012-07-26 DIAGNOSIS — L089 Local infection of the skin and subcutaneous tissue, unspecified: Secondary | ICD-10-CM

## 2012-07-26 DIAGNOSIS — L723 Sebaceous cyst: Secondary | ICD-10-CM

## 2012-07-26 NOTE — Progress Notes (Signed)
Status post excision of infected sebaceous cyst from the left chest on 07/17/12. The wound is healing very nicely. He is almost completely healed. No sign of residual infection. The cyst was entirely removed so there is no chance of recurrence. She should continue with Neosporin to the wound is completely healed. Followup as needed.  Wilmon Arms. Corliss Skains, MD, [facility] Surgery  General/ Trauma Surgery  07/26/2012 12:30 PM

## 2012-09-29 ENCOUNTER — Other Ambulatory Visit: Payer: Self-pay | Admitting: Internal Medicine

## 2012-10-29 ENCOUNTER — Other Ambulatory Visit: Payer: Self-pay | Admitting: Internal Medicine

## 2012-11-12 ENCOUNTER — Telehealth: Payer: Self-pay

## 2012-11-12 NOTE — Telephone Encounter (Signed)
Spoke with patient.  Advised her to follow up with Dr Lenord Fellers and have microalbumin/ blood work done to check her kidney function.  She says she will call.

## 2012-11-14 ENCOUNTER — Other Ambulatory Visit: Payer: Medicare Other | Admitting: Internal Medicine

## 2012-11-15 ENCOUNTER — Other Ambulatory Visit: Payer: Medicare Other | Admitting: Internal Medicine

## 2012-11-15 DIAGNOSIS — E119 Type 2 diabetes mellitus without complications: Secondary | ICD-10-CM

## 2012-12-04 ENCOUNTER — Other Ambulatory Visit: Payer: Self-pay | Admitting: *Deleted

## 2012-12-04 ENCOUNTER — Other Ambulatory Visit: Payer: Self-pay | Admitting: Internal Medicine

## 2012-12-04 MED ORDER — AUTOLET IMPRESSION MISC: Status: AC

## 2012-12-30 NOTE — Patient Instructions (Signed)
Continue same medications and return in 6 months. Have mammogram. 

## 2013-01-02 ENCOUNTER — Other Ambulatory Visit: Payer: Self-pay | Admitting: Internal Medicine

## 2013-01-08 ENCOUNTER — Ambulatory Visit (INDEPENDENT_AMBULATORY_CARE_PROVIDER_SITE_OTHER): Payer: Medicare Other | Admitting: Internal Medicine

## 2013-01-08 ENCOUNTER — Encounter: Payer: Self-pay | Admitting: Internal Medicine

## 2013-01-08 VITALS — BP 126/78 | HR 72 | Wt 193.5 lb

## 2013-01-08 DIAGNOSIS — Z79899 Other long term (current) drug therapy: Secondary | ICD-10-CM

## 2013-01-08 DIAGNOSIS — E119 Type 2 diabetes mellitus without complications: Secondary | ICD-10-CM

## 2013-01-08 DIAGNOSIS — E785 Hyperlipidemia, unspecified: Secondary | ICD-10-CM

## 2013-01-08 LAB — LIPID PANEL
CHOL/HDL RATIO: 3.2 ratio
Cholesterol: 164 mg/dL (ref 0–200)
HDL: 51 mg/dL (ref >39)
LDL Cholesterol: 70 mg/dL (ref 0–99)
TRIGLYCERIDES: 214 mg/dL — AB (ref <150)
VLDL: 43 mg/dL — ABNORMAL HIGH (ref 0–40)

## 2013-01-08 LAB — HEPATIC FUNCTION PANEL
ALBUMIN: 4.3 g/dL (ref 3.5–5.2)
ALK PHOS: 72 U/L (ref 39–117)
ALT: 12 U/L (ref 0–35)
AST: 13 U/L (ref 0–37)
BILIRUBIN TOTAL: 0.5 mg/dL (ref 0.3–1.2)
Bilirubin, Direct: 0.1 mg/dL (ref 0.0–0.3)
Indirect Bilirubin: 0.4 mg/dL (ref 0.0–0.9)
TOTAL PROTEIN: 7 g/dL (ref 6.0–8.3)

## 2013-01-08 LAB — HEMOGLOBIN A1C
HEMOGLOBIN A1C: 6.8 % — AB (ref <5.7)
Mean Plasma Glucose: 148 mg/dL — ABNORMAL HIGH (ref <117)

## 2013-01-08 NOTE — Progress Notes (Signed)
   Subjective:    Patient ID: Leah Barron, female    DOB: 06/20/1940, 73 y.o.   MRN: [id number]  HPI  She is here today for six-month followup. Says diabetes is under good control. Just returned from [place] for her brother was on a respirator but is doing better. In July she had an infected sebaceous cyst is excised from her left chest by surgeon. No more issues with that. No complaints today. Blood pressure is excellent. She had influenza vaccine at a local pharmacy.    Review of Systems     Objective:   Physical Exam Neck is supple without JVD thyromegaly or carotid bruits. Chest clear to auscultation. Cardiac exam regular rate and rhythm normal S1 and S2. Extremities without edema.       Assessment & Plan:  Controlled type 2 diabetes  Hyperlipidemia  Plan: Hemoglobin A1c drawn. Lipid panel liver functions drawn. Continue same medications and return in 6 months for physical exam.

## 2013-01-08 NOTE — Patient Instructions (Signed)
Return in July for physical exam. Continue same medications.

## 2013-01-08 NOTE — Addendum Note (Signed)
Addended by: Brett Canales on: 01/08/2013 10:39 AM   Modules accepted: Orders

## 2013-02-11 ENCOUNTER — Other Ambulatory Visit: Payer: Self-pay | Admitting: Internal Medicine

## 2013-02-11 ENCOUNTER — Other Ambulatory Visit: Payer: Self-pay

## 2013-04-29 ENCOUNTER — Other Ambulatory Visit: Payer: Self-pay

## 2013-04-29 MED ORDER — RAMIPRIL 2.5 MG PO CAPS: 2.5000 mg | ORAL_CAPSULE | Freq: Every day | ORAL | Status: DC

## 2013-05-14 ENCOUNTER — Other Ambulatory Visit: Payer: Self-pay

## 2013-05-14 MED ORDER — ATORVASTATIN CALCIUM 20 MG PO TABS: 20.0000 mg | ORAL_TABLET | Freq: Every day | ORAL | Status: DC

## 2013-07-09 ENCOUNTER — Encounter: Payer: Self-pay | Admitting: Internal Medicine

## 2013-07-09 ENCOUNTER — Ambulatory Visit (INDEPENDENT_AMBULATORY_CARE_PROVIDER_SITE_OTHER): Payer: Medicare Other | Admitting: Internal Medicine

## 2013-07-09 VITALS — BP 148/80 | HR 76 | Ht 61.0 in | Wt 192.5 lb

## 2013-07-09 DIAGNOSIS — Z0000 Encounter for general adult medical examination without abnormal findings: Secondary | ICD-10-CM

## 2013-07-09 DIAGNOSIS — E119 Type 2 diabetes mellitus without complications: Secondary | ICD-10-CM

## 2013-07-09 DIAGNOSIS — Z1329 Encounter for screening for other suspected endocrine disorder: Secondary | ICD-10-CM

## 2013-07-09 DIAGNOSIS — Z79899 Other long term (current) drug therapy: Secondary | ICD-10-CM

## 2013-07-09 DIAGNOSIS — M545 Low back pain, unspecified: Secondary | ICD-10-CM

## 2013-07-09 DIAGNOSIS — E559 Vitamin D deficiency, unspecified: Secondary | ICD-10-CM

## 2013-07-09 DIAGNOSIS — E785 Hyperlipidemia, unspecified: Secondary | ICD-10-CM

## 2013-07-09 LAB — CBC WITH DIFFERENTIAL/PLATELET
BASOS ABS: 0 10*3/uL (ref 0.0–0.1)
Basophils Relative: 0 % (ref 0–1)
EOS ABS: 0.1 10*3/uL (ref 0.0–0.7)
EOS PCT: 1 % (ref 0–5)
HEMATOCRIT: 38.4 % (ref 36.0–46.0)
Hemoglobin: 13.2 g/dL (ref 12.0–15.0)
LYMPHS PCT: 43 % (ref 12–46)
Lymphs Abs: 2.2 10*3/uL (ref 0.7–4.0)
MCH: 30.1 pg (ref 26.0–34.0)
MCHC: 34.4 g/dL (ref 30.0–36.0)
MCV: 87.5 fL (ref 78.0–100.0)
MONO ABS: 0.3 10*3/uL (ref 0.1–1.0)
Monocytes Relative: 6 % (ref 3–12)
Neutro Abs: 2.6 10*3/uL (ref 1.7–7.7)
Neutrophils Relative %: 50 % (ref 43–77)
PLATELETS: 280 10*3/uL (ref 150–400)
RBC: 4.39 MIL/uL (ref 3.87–5.11)
RDW: 14.4 % (ref 11.5–15.5)
WBC: 5.2 10*3/uL (ref 4.0–10.5)

## 2013-07-09 LAB — POCT URINALYSIS DIPSTICK
Bilirubin, UA: NEGATIVE
Glucose, UA: NEGATIVE
KETONES UA: NEGATIVE
Leukocytes, UA: NEGATIVE
Nitrite, UA: NEGATIVE
PH UA: 7
PROTEIN UA: NEGATIVE
SPEC GRAV UA: 1.01
UROBILINOGEN UA: NEGATIVE

## 2013-07-09 LAB — HEMOGLOBIN A1C
Hgb A1c MFr Bld: 6.5 % — ABNORMAL HIGH (ref <5.7)
Mean Plasma Glucose: 140 mg/dL — ABNORMAL HIGH (ref <117)

## 2013-07-09 LAB — LIPID PANEL
CHOL/HDL RATIO: 2.9 ratio
CHOLESTEROL: 170 mg/dL (ref 0–200)
HDL: 59 mg/dL (ref >39)
LDL Cholesterol: 79 mg/dL (ref 0–99)
Triglycerides: 162 mg/dL — ABNORMAL HIGH (ref <150)
VLDL: 32 mg/dL (ref 0–40)

## 2013-07-09 LAB — COMPREHENSIVE METABOLIC PANEL
ALT: 13 U/L (ref 0–35)
AST: 12 U/L (ref 0–37)
Albumin: 4.4 g/dL (ref 3.5–5.2)
Alkaline Phosphatase: 74 U/L (ref 39–117)
BILIRUBIN TOTAL: 0.5 mg/dL (ref 0.2–1.2)
BUN: 16 mg/dL (ref 6–23)
CALCIUM: 9.9 mg/dL (ref 8.4–10.5)
CHLORIDE: 104 meq/L (ref 96–112)
CO2: 28 meq/L (ref 19–32)
CREATININE: 0.82 mg/dL (ref 0.50–1.10)
Glucose, Bld: 114 mg/dL — ABNORMAL HIGH (ref 70–99)
Potassium: 3.7 mEq/L (ref 3.5–5.3)
Sodium: 140 mEq/L (ref 135–145)
Total Protein: 7.1 g/dL (ref 6.0–8.3)

## 2013-07-09 MED ORDER — HYDROCODONE-ACETAMINOPHEN 5-325 MG PO TABS: 1.0000 | ORAL_TABLET | Freq: Four times a day (QID) | ORAL | Status: DC | PRN

## 2013-07-09 NOTE — Patient Instructions (Signed)
Return for repeat blood pressure check  July 28th. Purchase home blood pressure monitor.

## 2013-07-09 NOTE — Progress Notes (Signed)
Subjective:    Patient ID: Leah Barron, female    DOB: 1940-04-12, 73 y.o.   MRN: [id number]  HPI 73 year old Black  Female in today for health maintenance exam and evaluation of medical issues. She has a history of type 2 diabetes mellitus, metabolic syndrome, obesity, hyperlipidemia, microscopic hematuria previously evaluated by urologist, GE reflux, history of adenomatous colon polyps, lumbar disc herniation L5-S1, vitamin D deficiency, anxiety.  Patient denies noncompliance with medications.  Past medical history: Cholecystectomy 1998, hysterectomy without oophorectomy 1981, left breast biopsy 1978, bladder biopsies for microscopic hematuria in [place] both of which were benign. In 1996, she suffered a left ankle fracture secondary to a fall. History of recurrent low back pain related to lumbar disc disease.  A. colonoscopy in 2008.  She is intolerant of codeine and says that [place]  has caused a possible adverse reaction in the past. Is able to take hydrocodone.  Social history: She is a widow. Husband died of complications of cirrhosis of the liver. One daughter with history of mental illness. One daughter lives here with history of HIV. Patient has been living with his daughter and taking care of her. Patient also has a separate residence. Patient does not smoke or consume alcohol. She is a Programme researcher, broadcasting/film/video.  Family history: Total of 6 brothers. One brother with history of hypertension and kidney failure. One brother with history of diabetes mellitus coronary artery disease and mental illness. One brother died of lung cancer. One sister died with diabetes mellitus, heart and kidney failure. One sister died of cardiac arrest.    Review of Systems  Constitutional: Negative.   HENT: Negative.   Eyes:       Saw optometrist November 2014  Respiratory: Negative.   Cardiovascular: Negative.   Endocrine: Negative.   Genitourinary:       History of benign microscopic  hematuria with negative bladder biopsy  Musculoskeletal:       Recurrent low back pain  Psychiatric/Behavioral:       Anxiety. Says daughter and grandson were in an argument last night which upset her.       Objective:   Physical Exam  Vitals reviewed. Constitutional: She is oriented to person, place, and time. She appears well-developed and well-nourished. No distress.  HENT:  Head: Normocephalic and atraumatic.  Right Ear: External ear normal.  Left Ear: External ear normal.  Mouth/Throat: Oropharynx is clear and moist.  Eyes: Conjunctivae and EOM are normal. Pupils are equal, round, and reactive to light. Right eye exhibits no discharge. Left eye exhibits no discharge. No scleral icterus.  Neck: Neck supple. No JVD present. No thyromegaly present.  Cardiovascular: Normal rate, regular rhythm, normal heart sounds and intact distal pulses.  Exam reveals no gallop.   No murmur heard. Pulmonary/Chest: Effort normal and breath sounds normal. No respiratory distress. She has no wheezes. She has no rales.  Abdominal: Soft. Bowel sounds are normal. She exhibits no distension and no mass. There is no tenderness. There is no rebound and no guarding.  Genitourinary:  Deferred-has GYN physician  Musculoskeletal: Normal range of motion. She exhibits no edema.  Lymphadenopathy:    She has no cervical adenopathy.  Neurological: She is alert and oriented to person, place, and time. She has normal reflexes. No cranial nerve deficit. Coordination normal.  Skin: Skin is warm and dry. No rash noted. She is not diaphoretic.  Psychiatric: She has a normal mood and affect. Her behavior is normal. Judgment and  thought content normal.          Assessment & Plan:  Controlled type 2 diabetes mellitus  Hyperlipidemia  History of vitamin D deficiency  History of anxiety  Remote history of positive PPD  GE reflux  Lumbar disc disease  Benign microscopic hematuria  History of adenomatous  colon polyps- colonoscopy 2008  Elevated blood pressure-blood pressure is 148/80. Patient says she is anxious and has been upset over and argument daughter and grandson had last evening. Previous blood pressure January 2015 126/78.  Plan: Patient is to return in 2-3 weeks for repeat blood pressure check. She is on low-dose Altace for diabetes and this may need to be increased. Fasting labs are pending. Purchase home blood pressure cuff and monitor BP. Will Prescription given.    Subjective:   Patient presents for Medicare Annual/Subsequent preventive examination.  Review Past Medical/Family/Social:see above   Risk Factors  Current exercise habits: fairly sedentary because of back issues but will get on treadmill some Dietary issues discussed: low fat low carb  Cardiac risk factors: HTN, hyperlipidemia DM  Depression Screen  (Note: if answer to either of the following is "Yes", a more complete depression screening is indicated)   Over the past two weeks, have you felt down, depressed or hopeless? No  Over the past two weeks, have you felt little interest or pleasure in doing things? No Have you lost interest or pleasure in daily life? No Do you often feel hopeless? No Do you cry easily over simple problems? No   Activities of Daily Living  In your present state of health, do you have any difficulty performing the following activities?:   Driving? No  Managing money? No  Feeding yourself? No  Getting from bed to chair? No  Climbing a flight of stairs? No  Preparing food and eating?: No  Bathing or showering? No  Getting dressed: No  Getting to the toilet? No  Using the toilet:No  Moving around from place to place: No  In the past year have you fallen or had a near fall?:No  Are you sexually active? No  Do you have more than one partner? No   Hearing Difficulties: No  Do you often ask people to speak up or repeat themselves? No  Do you experience ringing or noises in  your ears? Occasionally in right ear Do you have difficulty understanding soft or whispered voices? No  Do you feel that you have a problem with memory? No Do you often misplace items? No    Home Safety:  Do you have a smoke alarm at your residence? Yes Do you have grab bars in the bathroom?no Do you have throw rugs in your house?yes   Cognitive Testing  Alert? Yes Normal Appearance?Yes  Oriented to person? Yes Place? Yes  Time? Yes  Recall of three objects? Yes  Can perform simple calculations? Yes  Displays appropriate judgment?Yes  Can read the correct time from a watch face?Yes   List the Names of Other Physician/Practitioners you currently use:  See referral list for the physicians patient is currently seeing.  Optometrist   Review of Systems:   Objective:     General appearance: Appears stated age and  obese  Head: Normocephalic, without obvious abnormality, atraumatic  Eyes: conj clear, EOMi PEERLA  Ears: normal TM's and external ear canals both ears  Nose: Nares normal. Septum midline. Mucosa normal. No drainage or sinus tenderness.  Throat: lips, mucosa, and tongue normal; teeth and gums normal  Neck: no adenopathy, no carotid bruit, no JVD, supple, symmetrical, trachea midline and thyroid not enlarged, symmetric, no tenderness/mass/nodules  No CVA tenderness.  Lungs: clear to auscultation bilaterally  Breasts: normal appearance, no masses or tenderness Heart: regular rate and rhythm, S1, S2 normal, no murmur, click, rub or gallop  Abdomen: soft, non-tender; bowel sounds normal; no masses, no organomegaly  Musculoskeletal: ROM normal in all joints, no crepitus, no deformity, Normal muscle strengthen. Back  is symmetric, no curvature. Skin: Skin color, texture, turgor normal. No rashes or lesions  Lymph nodes: Cervical, supraclavicular, and axillary nodes normal.  Neurologic: CN 2 -12 Normal, Normal symmetric reflexes. Normal coordination and gait  Psych: Alert  & Oriented x 3, Mood appear stable.    Assessment:    Annual wellness medicare exam   Plan:    During the course of the visit the patient was educated and counseled about appropriate screening and preventive services including:   Annual mammogram Diabetic eye exam- done November 2014 by optometrist     Patient Instructions (the written plan) was given to the patient.  Medicare Attestation  I have personally reviewed:  The patient's medical and social history  Their use of alcohol, tobacco or illicit drugs  Their current medications and supplements  The patient's functional ability including ADLs,fall risks, home safety risks, cognitive, and hearing and visual impairment  Diet and physical activities  Evidence for depression or mood disorders  The patient's weight, height, BMI, and visual acuity have been recorded in the chart. I have made referrals, counseling, and provided education to the patient based on review of the above and I have provided the patient with a written personalized care plan for preventive services.

## 2013-07-10 LAB — TSH: TSH: 0.898 u[IU]/mL (ref 0.350–4.500)

## 2013-07-10 LAB — VITAMIN D 25 HYDROXY (VIT D DEFICIENCY, FRACTURES): Vit D, 25-Hydroxy: 55 ng/mL (ref 30–89)

## 2013-07-30 ENCOUNTER — Ambulatory Visit (INDEPENDENT_AMBULATORY_CARE_PROVIDER_SITE_OTHER): Payer: Medicare Other | Admitting: Internal Medicine

## 2013-07-30 DIAGNOSIS — I10 Essential (primary) hypertension: Secondary | ICD-10-CM

## 2013-07-30 NOTE — Patient Instructions (Signed)
Increase Ramipril to 5 milligrams daily and return in 2 weeks

## 2013-07-30 NOTE — Progress Notes (Signed)
   Subjective:    Patient ID: Leah Barron, female    DOB: 11-10-1940, 73 y.o.   MRN: [id number]  HPI  Here today to followup on hypertension. Blood pressure still elevated at 295 systolically. Says it is labile systolically but diastolics are good at home.    Review of Systems     Objective:   Physical Exam  Not examined. She is currently on Ramapipril  2.5 mg daily and just had it refilled.      Assessment & Plan:  Hypertension  Goal is to have blood pressure 130/80 or better. Increase Ramapril to 5 mg daily and return in 2 weeks

## 2013-08-09 ENCOUNTER — Encounter: Payer: Self-pay | Admitting: Internal Medicine

## 2013-08-09 ENCOUNTER — Ambulatory Visit (INDEPENDENT_AMBULATORY_CARE_PROVIDER_SITE_OTHER): Payer: Medicare Other | Admitting: Internal Medicine

## 2013-08-09 VITALS — BP 122/84 | HR 72 | Temp 98.0°F | Wt 192.0 lb

## 2013-08-09 DIAGNOSIS — M792 Neuralgia and neuritis, unspecified: Secondary | ICD-10-CM

## 2013-08-09 DIAGNOSIS — H9209 Otalgia, unspecified ear: Secondary | ICD-10-CM

## 2013-08-09 DIAGNOSIS — IMO0002 Reserved for concepts with insufficient information to code with codable children: Secondary | ICD-10-CM

## 2013-08-09 DIAGNOSIS — H9201 Otalgia, right ear: Secondary | ICD-10-CM

## 2013-08-09 MED ORDER — AMOXICILLIN 500 MG PO CAPS: 500.0000 mg | ORAL_CAPSULE | Freq: Three times a day (TID) | ORAL | Status: DC

## 2013-08-09 NOTE — Progress Notes (Signed)
   Subjective:    Patient ID: Leah Barron, female    DOB: 04-21-1940, 73 y.o.   MRN: [id number]  HPI  Complaining of right ear pain and right parietal pain. Daughter has been very sick in the hospital with asthma. Pain has been present since Sunday around the time daughter was admitted. Last fall she went to see Dr. Ernesto Rutherford to have cerumen removed from right external ear or canal. Was given an antibiotic at that time. She also was to return next week for blood pressure check. Her blood pressure is excellent today. Is very worried about her daughter.    Review of Systems     Objective:   Physical Exam Right TM is slightly full but not red. Right external ear canal is normal. Left TM is normal. She has some palpable tenderness along her right parietal scalp and top of scalp. No lesions consistent with shingles.        Assessment & Plan:  Right otalgia  Right neuralgia  Plan: Offered a short course of prednisone but she declined. She may take Xanax twice daily as previously prescribed. Amoxicillin 500 mg 3 times daily for 10 days. She does have pain medication to take should she need it.  Does not need to return for blood pressure check next week. Blood pressure is excellent. She is to return January 2016 for six-month recheck. Last physical was July 7.

## 2013-08-09 NOTE — Patient Instructions (Signed)
Take amoxicillin 500 mg 3 times daily for 10 days. Take Xanax for anxiety. Return January for six-month recheck

## 2013-08-14 ENCOUNTER — Encounter: Payer: Medicare Other | Admitting: Internal Medicine

## 2013-09-05 ENCOUNTER — Other Ambulatory Visit: Payer: Self-pay

## 2013-09-05 MED ORDER — RAMIPRIL 2.5 MG PO CAPS: 2.5000 mg | ORAL_CAPSULE | Freq: Every day | ORAL | Status: DC

## 2013-09-09 ENCOUNTER — Telehealth: Payer: Self-pay | Admitting: Internal Medicine

## 2013-09-09 MED ORDER — RAMIPRIL 5 MG PO CAPS: 5.0000 mg | ORAL_CAPSULE | Freq: Every day | ORAL | Status: DC

## 2013-09-09 NOTE — Telephone Encounter (Signed)
Patient called and said [facility] was requesting refill on  Ramapril. We have not received any E- prescribe or fax refill request from [facility] this entire weekend because I have been here every day this Labor Day weekend doing refills. She currently is on [place] 5 mg daily. We will call in refill to [facility].  She has to go out of town urgently. This is where she requests that we call this refill to.

## 2013-09-23 ENCOUNTER — Telehealth: Payer: Self-pay

## 2013-09-23 NOTE — Telephone Encounter (Signed)
Refill request from [facility] for xanax 0.5mg  #60 bid prn, and omeprazole 40mg  #30 poqd.  Please advise.

## 2013-09-23 NOTE — Telephone Encounter (Signed)
Leah Barron will fill.Pt needs to decide on one drug store or another. Switching from [facility] to [place] in just not working for [place].

## 2013-09-24 MED ORDER — OMEPRAZOLE 40 MG PO CPDR: DELAYED_RELEASE_CAPSULE | ORAL | Status: DC

## 2013-09-24 MED ORDER — ALPRAZOLAM 0.5 MG PO TABS: ORAL_TABLET | ORAL | Status: DC

## 2013-09-24 NOTE — Telephone Encounter (Signed)
Xanax and omeprazole refilled at [facility].

## 2013-11-08 ENCOUNTER — Other Ambulatory Visit: Payer: Self-pay | Admitting: Internal Medicine

## 2013-11-08 MED ORDER — ULTILET CLASSIC LANCETS MISC: Status: DC

## 2013-11-12 ENCOUNTER — Other Ambulatory Visit: Payer: Self-pay | Admitting: Internal Medicine

## 2013-11-12 NOTE — Telephone Encounter (Signed)
Refill x 6 months please

## 2013-11-12 NOTE — Telephone Encounter (Signed)
Xanax called into [facility] pharmacy.

## 2013-12-09 ENCOUNTER — Telehealth: Payer: Self-pay

## 2013-12-09 NOTE — Telephone Encounter (Signed)
Patient received flu vaccine 10/04/2013 at [facility].

## 2013-12-17 ENCOUNTER — Other Ambulatory Visit: Payer: Self-pay | Admitting: Internal Medicine

## 2014-01-16 ENCOUNTER — Ambulatory Visit (INDEPENDENT_AMBULATORY_CARE_PROVIDER_SITE_OTHER): Payer: Medicare Other | Admitting: Internal Medicine

## 2014-01-16 ENCOUNTER — Encounter: Payer: Self-pay | Admitting: Internal Medicine

## 2014-01-16 VITALS — BP 148/76 | HR 68 | Temp 98.2°F | Wt 192.0 lb

## 2014-01-16 DIAGNOSIS — E785 Hyperlipidemia, unspecified: Secondary | ICD-10-CM

## 2014-01-16 DIAGNOSIS — M5431 Sciatica, right side: Secondary | ICD-10-CM | POA: Diagnosis not present

## 2014-01-16 DIAGNOSIS — Z79899 Other long term (current) drug therapy: Secondary | ICD-10-CM

## 2014-01-16 DIAGNOSIS — E119 Type 2 diabetes mellitus without complications: Secondary | ICD-10-CM | POA: Diagnosis not present

## 2014-01-16 DIAGNOSIS — I10 Essential (primary) hypertension: Secondary | ICD-10-CM

## 2014-01-16 DIAGNOSIS — R030 Elevated blood-pressure reading, without diagnosis of hypertension: Secondary | ICD-10-CM

## 2014-01-16 LAB — LIPID PANEL
CHOL/HDL RATIO: 3.1 ratio
CHOLESTEROL: 147 mg/dL (ref 0–200)
HDL: 48 mg/dL (ref >39)
LDL Cholesterol: 72 mg/dL (ref 0–99)
Triglycerides: 134 mg/dL (ref <150)
VLDL: 27 mg/dL (ref 0–40)

## 2014-01-16 LAB — HEPATIC FUNCTION PANEL
ALBUMIN: 3.8 g/dL (ref 3.5–5.2)
ALT: 11 U/L (ref 0–35)
AST: 12 U/L (ref 0–37)
Alkaline Phosphatase: 67 U/L (ref 39–117)
BILIRUBIN INDIRECT: 0.2 mg/dL (ref 0.2–1.2)
BILIRUBIN TOTAL: 0.3 mg/dL (ref 0.2–1.2)
Bilirubin, Direct: 0.1 mg/dL (ref 0.0–0.3)
TOTAL PROTEIN: 6.8 g/dL (ref 6.0–8.3)

## 2014-01-16 LAB — HEMOGLOBIN A1C
Hgb A1c MFr Bld: 6.5 % — ABNORMAL HIGH (ref <5.7)
MEAN PLASMA GLUCOSE: 140 mg/dL — AB (ref <117)

## 2014-01-16 MED ORDER — HYDROCODONE-ACETAMINOPHEN 5-325 MG PO TABS: 1.0000 | ORAL_TABLET | Freq: Four times a day (QID) | ORAL | Status: DC | PRN

## 2014-01-16 NOTE — Progress Notes (Signed)
   Subjective:    Patient ID: Leah Barron, female    DOB: 12-Jan-1940, 74 y.o.   MRN: [id number]  HPI 74 year old Black Female in today for six-month recheck. History of diabetes, hyperlipidemia, hypertension, anxiety, right sciatica. Has been bothered with sciatica more recently after going to the [facility]. Complaining of pain in right buttock. Says blood pressures well controlled at home usually around 342 systolically. It's elevated today I think because of anxiety. 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 Says she has a history of whitecoat hypertension. Otherwise she's doing pre-well. Tries to go to the gym some. For while she lived with her daughter who had been in an automobile accident. She is now back home in [place].    Review of Systems     Objective:   Physical Exam Skin warm and dry. Nodes none. Chest clear to auscultation. Cardiac exam regular rate and rhythm. Extremities without edema. Straight leg raising is negative at 90 bilaterally. Muscle strength in the right lower extremity is normal. Diabetic foot exam performed       Assessment & Plan:

## 2014-01-16 NOTE — Patient Instructions (Addendum)
Return in 6 months for physical examination. Refill Norco5/325   # 60 one by mouth every 8 hours when necessary back pain. Continue same medications as previously prescribed. Lab work is pending.

## 2014-02-17 ENCOUNTER — Other Ambulatory Visit: Payer: Self-pay | Admitting: Internal Medicine

## 2014-04-15 DIAGNOSIS — R350 Frequency of micturition: Secondary | ICD-10-CM | POA: Diagnosis not present

## 2014-04-15 DIAGNOSIS — R312 Other microscopic hematuria: Secondary | ICD-10-CM | POA: Diagnosis not present

## 2014-04-25 ENCOUNTER — Ambulatory Visit (INDEPENDENT_AMBULATORY_CARE_PROVIDER_SITE_OTHER): Payer: Medicare Other | Admitting: Internal Medicine

## 2014-04-25 ENCOUNTER — Encounter: Payer: Self-pay | Admitting: Internal Medicine

## 2014-04-25 VITALS — BP 132/78 | HR 82 | Temp 97.8°F | Wt 190.0 lb

## 2014-04-25 DIAGNOSIS — H65111 Acute and subacute allergic otitis media (mucoid) (sanguinous) (serous), right ear: Secondary | ICD-10-CM

## 2014-04-25 DIAGNOSIS — H6091 Unspecified otitis externa, right ear: Secondary | ICD-10-CM

## 2014-04-25 DIAGNOSIS — J029 Acute pharyngitis, unspecified: Secondary | ICD-10-CM

## 2014-04-25 DIAGNOSIS — J069 Acute upper respiratory infection, unspecified: Secondary | ICD-10-CM | POA: Diagnosis not present

## 2014-04-25 MED ORDER — NEOMYCIN-POLYMYXIN-HC 3.5-10000-1 OT SOLN: 4.0000 [drp] | Freq: Four times a day (QID) | OTIC | Status: DC

## 2014-04-25 MED ORDER — CLARITHROMYCIN 500 MG PO TABS: 500.0000 mg | ORAL_TABLET | Freq: Two times a day (BID) | ORAL | Status: DC

## 2014-04-25 MED ORDER — HYDROCODONE-HOMATROPINE 5-1.5 MG/5ML PO SYRP: 5.0000 mL | ORAL_SOLUTION | Freq: Three times a day (TID) | ORAL | Status: DC | PRN

## 2014-04-25 NOTE — Progress Notes (Signed)
   Subjective:    Patient ID: Leah Barron, female    DOB: 1940/05/19, 74 y.o.   MRN: [id number]  HPI Onset Tuesday, April 19 with sore throat. Now has developed right ear pain. Has been hoarse and congested. Sputum is cloudy. Not much cough. No fever or shaking chills.    Review of Systems     Objective:   Physical Exam  Right external ear canal is red. Right TM is fiery red. Left TM is dull. Pharynx is red. Rapid strep screen is negative. Neck is supple without adenopathy. Chest clear to auscultation without rales or wheezing      Assessment & Plan:  Right otitis externa and otitis media  Pharyngitis  Acute upper respiratory infection  Plan: Biaxin 500 mg twice daily for 10 days. Cortisporin Otic suspension 4 drops in right ear canal 4 times daily for 5 days. Hycodan 1 teaspoon by mouth every 8 hours when necessary year pain, cough, sore throat pain.  Patient requesting refill on Norco 5/325. Told her to request this at a later date since we are giving her narcotic cough medication today.

## 2014-04-28 ENCOUNTER — Other Ambulatory Visit: Payer: Self-pay | Admitting: Internal Medicine

## 2014-04-28 LAB — POCT RAPID STREP A (OFFICE): Rapid Strep A Screen: NEGATIVE

## 2014-04-28 NOTE — Addendum Note (Signed)
Addended by: Leota Jacobsen on: 04/28/2014 08:49 AM   Modules accepted: Orders

## 2014-04-30 ENCOUNTER — Other Ambulatory Visit: Payer: Self-pay | Admitting: *Deleted

## 2014-04-30 MED ORDER — GLUCOSE BLOOD VI STRP: ORAL_STRIP | Status: DC

## 2014-05-01 DIAGNOSIS — Z1231 Encounter for screening mammogram for malignant neoplasm of breast: Secondary | ICD-10-CM | POA: Diagnosis not present

## 2014-05-01 DIAGNOSIS — Z803 Family history of malignant neoplasm of breast: Secondary | ICD-10-CM | POA: Diagnosis not present

## 2014-05-02 ENCOUNTER — Other Ambulatory Visit: Payer: Self-pay | Admitting: *Deleted

## 2014-05-02 MED ORDER — ACCU-CHEK AVIVA PLUS W/DEVICE KIT: PACK | Status: DC

## 2014-05-20 DIAGNOSIS — Z961 Presence of intraocular lens: Secondary | ICD-10-CM | POA: Diagnosis not present

## 2014-05-20 DIAGNOSIS — H3531 Nonexudative age-related macular degeneration: Secondary | ICD-10-CM | POA: Diagnosis not present

## 2014-05-23 ENCOUNTER — Encounter: Payer: Self-pay | Admitting: Internal Medicine

## 2014-05-27 DIAGNOSIS — N830 Follicular cyst of ovary: Secondary | ICD-10-CM | POA: Diagnosis not present

## 2014-06-09 ENCOUNTER — Other Ambulatory Visit: Payer: Self-pay | Admitting: Internal Medicine

## 2014-06-09 NOTE — Telephone Encounter (Signed)
Refill x 6 months 

## 2014-06-24 ENCOUNTER — Encounter: Payer: Self-pay | Admitting: Internal Medicine

## 2014-07-14 ENCOUNTER — Telehealth: Payer: Self-pay | Admitting: Internal Medicine

## 2014-07-14 MED ORDER — OMEPRAZOLE 40 MG PO CPDR: DELAYED_RELEASE_CAPSULE | ORAL | Status: DC

## 2014-07-14 MED ORDER — METFORMIN HCL 500 MG PO TABS: 500.0000 mg | ORAL_TABLET | Freq: Two times a day (BID) | ORAL | Status: DC

## 2014-07-14 NOTE — Telephone Encounter (Signed)
Patient requests 90 day supply on meds . Called pharmacy ok to dispense 90 day supply

## 2014-07-14 NOTE — Telephone Encounter (Signed)
Amy from [facility] called and would like to get Metformin and Omeprazole on a 90 day refill so that they can "synch" all of pt medications together.  Best number to call Amy at [facility] is [phone] / lt

## 2014-07-15 ENCOUNTER — Ambulatory Visit (INDEPENDENT_AMBULATORY_CARE_PROVIDER_SITE_OTHER): Payer: Medicare Other | Admitting: Internal Medicine

## 2014-07-15 ENCOUNTER — Encounter: Payer: Self-pay | Admitting: Internal Medicine

## 2014-07-15 VITALS — BP 126/76 | HR 72 | Temp 98.1°F | Ht 62.0 in | Wt 188.0 lb

## 2014-07-15 DIAGNOSIS — E8881 Metabolic syndrome: Secondary | ICD-10-CM | POA: Diagnosis not present

## 2014-07-15 DIAGNOSIS — E119 Type 2 diabetes mellitus without complications: Secondary | ICD-10-CM

## 2014-07-15 DIAGNOSIS — E669 Obesity, unspecified: Secondary | ICD-10-CM | POA: Diagnosis not present

## 2014-07-15 DIAGNOSIS — Z0000 Encounter for general adult medical examination without abnormal findings: Secondary | ICD-10-CM | POA: Diagnosis not present

## 2014-07-15 DIAGNOSIS — Z860101 Personal history of adenomatous and serrated colon polyps: Secondary | ICD-10-CM

## 2014-07-15 DIAGNOSIS — R5383 Other fatigue: Secondary | ICD-10-CM | POA: Diagnosis not present

## 2014-07-15 DIAGNOSIS — E559 Vitamin D deficiency, unspecified: Secondary | ICD-10-CM | POA: Diagnosis not present

## 2014-07-15 DIAGNOSIS — F411 Generalized anxiety disorder: Secondary | ICD-10-CM | POA: Diagnosis not present

## 2014-07-15 DIAGNOSIS — Z23 Encounter for immunization: Secondary | ICD-10-CM | POA: Diagnosis not present

## 2014-07-15 DIAGNOSIS — R312 Other microscopic hematuria: Secondary | ICD-10-CM | POA: Diagnosis not present

## 2014-07-15 DIAGNOSIS — Z86010 Personal history of colonic polyps: Secondary | ICD-10-CM

## 2014-07-15 DIAGNOSIS — R3129 Other microscopic hematuria: Secondary | ICD-10-CM

## 2014-07-15 DIAGNOSIS — K219 Gastro-esophageal reflux disease without esophagitis: Secondary | ICD-10-CM

## 2014-07-15 DIAGNOSIS — M519 Unspecified thoracic, thoracolumbar and lumbosacral intervertebral disc disorder: Secondary | ICD-10-CM | POA: Diagnosis not present

## 2014-07-15 DIAGNOSIS — Z79899 Other long term (current) drug therapy: Secondary | ICD-10-CM

## 2014-07-15 DIAGNOSIS — E785 Hyperlipidemia, unspecified: Secondary | ICD-10-CM | POA: Diagnosis not present

## 2014-07-15 LAB — COMPLETE METABOLIC PANEL WITH GFR
ALBUMIN: 4 g/dL (ref 3.5–5.2)
ALT: 17 U/L (ref 0–35)
AST: 15 U/L (ref 0–37)
Alkaline Phosphatase: 77 U/L (ref 39–117)
BUN: 22 mg/dL (ref 6–23)
CO2: 30 meq/L (ref 19–32)
CREATININE: 0.92 mg/dL (ref 0.50–1.10)
Calcium: 10 mg/dL (ref 8.4–10.5)
Chloride: 103 mEq/L (ref 96–112)
GFR, Est African American: 71 mL/min
GFR, Est Non African American: 62 mL/min
GLUCOSE: 114 mg/dL — AB (ref 70–99)
Potassium: 3.7 mEq/L (ref 3.5–5.3)
Sodium: 142 mEq/L (ref 135–145)
TOTAL PROTEIN: 7.1 g/dL (ref 6.0–8.3)
Total Bilirubin: 0.5 mg/dL (ref 0.2–1.2)

## 2014-07-15 LAB — CBC WITH DIFFERENTIAL/PLATELET
Basophils Absolute: 0 10*3/uL (ref 0.0–0.1)
Basophils Relative: 0 % (ref 0–1)
EOS ABS: 0.1 10*3/uL (ref 0.0–0.7)
EOS PCT: 1 % (ref 0–5)
HEMATOCRIT: 39.8 % (ref 36.0–46.0)
Hemoglobin: 13.2 g/dL (ref 12.0–15.0)
Lymphocytes Relative: 43 % (ref 12–46)
Lymphs Abs: 2.5 10*3/uL (ref 0.7–4.0)
MCH: 30.6 pg (ref 26.0–34.0)
MCHC: 33.2 g/dL (ref 30.0–36.0)
MCV: 92.3 fL (ref 78.0–100.0)
MPV: 8.9 fL (ref 8.6–12.4)
Monocytes Absolute: 0.3 10*3/uL (ref 0.1–1.0)
Monocytes Relative: 6 % (ref 3–12)
Neutro Abs: 2.9 10*3/uL (ref 1.7–7.7)
Neutrophils Relative %: 50 % (ref 43–77)
Platelets: 280 10*3/uL (ref 150–400)
RBC: 4.31 MIL/uL (ref 3.87–5.11)
RDW: 14.1 % (ref 11.5–15.5)
WBC: 5.7 10*3/uL (ref 4.0–10.5)

## 2014-07-15 LAB — LIPID PANEL
CHOL/HDL RATIO: 3.4 ratio
Cholesterol: 168 mg/dL (ref 0–200)
HDL: 50 mg/dL (ref >=46)
LDL Cholesterol: 83 mg/dL (ref 0–99)
Triglycerides: 176 mg/dL — ABNORMAL HIGH (ref <150)
VLDL: 35 mg/dL (ref 0–40)

## 2014-07-15 LAB — TSH: TSH: 0.648 u[IU]/mL (ref 0.350–4.500)

## 2014-07-15 MED ORDER — HYDROCODONE-ACETAMINOPHEN 5-325 MG PO TABS: 1.0000 | ORAL_TABLET | Freq: Four times a day (QID) | ORAL | Status: DC | PRN

## 2014-07-15 MED ORDER — ATORVASTATIN CALCIUM 10 MG PO TABS: 10.0000 mg | ORAL_TABLET | Freq: Every day | ORAL | Status: DC

## 2014-07-15 NOTE — Progress Notes (Signed)
Subjective:    Patient ID: Leah Barron, female    DOB: 07-Feb-1940, 74 y.o.   MRN: [id number]  HPI 74 year old black female in today for health maintenance exam. Weight today is 188 pounds and July 2015 was 192 pounds. Doesn't get a lot of exercise. History of back pain. Mostly sedentary. History of controlled high 2 diabetes mellitus, history of vitamin D deficiency, metabolic syndrome, obesity, hyperlipidemia, microscopic hematuria previously evaluated by urologist, GE reflux. History of adenomatous colon polyps. History of lumbar disc herniation L5-S1, vitamin D deficiency, anxiety.  Patient denies noncompliance with medications.  Past medical history: Cholecystectomy 1998, hysterectomy without oophorectomy 1981, left breast biopsy 1978, bladder biopsies for microscopic hematuria in [place] both of which were benign. In 1996, she suffered a left ankle fracture secondary to a fall. History of recurrent low back pain related to lumbar disc disease. Colonoscopy in 2008.  Intolerant of codeine says that [place] for[place] has caused possible adverse reaction in the past. Is able to take hydrocodone.  Social history: She is a widow. Husband died of complications competitions of cirrhosis of the liver. One daughter with history of HIV doing fairly well. One daughter with history of mental illness. Patient does not smoke or consume alcohol. 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  Family history: Total of 6 brothers. One brother with history of hypertension and kidney failure, one brother with history of diabetes mellitus, coronary artery disease, mental illness. One brother died with lung cancer. One sister died with diabetes mellitus, heart and kidney failure. One sister died of cardiac arrest.  Patient saw optometrist November 2014. Reminded about diabetic eye exam.  Review of Systems  Constitutional: Positive for fatigue.  HENT: Negative.   Respiratory: Negative.   Cardiovascular:  Negative for chest pain.  Gastrointestinal: Negative.   Musculoskeletal:       Recurrent low back pain       Objective:   Physical Exam  Constitutional: She is oriented to person, place, and time. She appears well-developed and well-nourished. No distress.  HENT:  Head: Normocephalic and atraumatic.  Right Ear: External ear normal.  Left Ear: External ear normal.  Mouth/Throat: Oropharynx is clear and moist.  Eyes: Conjunctivae and EOM are normal. Pupils are equal, round, and reactive to light. Right eye exhibits no discharge. Left eye exhibits no discharge. No scleral icterus.  Neck: Neck supple. No JVD present. No thyromegaly present.  Cardiovascular: Normal rate, regular rhythm, normal heart sounds and intact distal pulses.   No murmur heard. Pulmonary/Chest: Effort normal and breath sounds normal. No respiratory distress. She has no rales. She exhibits no tenderness.  Abdominal: Soft. Bowel sounds are normal. She exhibits no distension and no mass. There is no tenderness. There is no rebound and no guarding.  Genitourinary:  Deferred has GYN physician  Musculoskeletal: Normal range of motion. She exhibits no edema.  Lymphadenopathy:    She has no cervical adenopathy.  Neurological: She is alert and oriented to person, place, and time. She has normal reflexes. No cranial nerve deficit. Coordination normal.  Skin: Skin is warm and dry. No rash noted. She is not diaphoretic.  Psychiatric: She has a normal mood and affect. Her behavior is normal. Judgment and thought content normal.  Vitals reviewed.         Assessment & Plan:  Controlled type 2 diabetes mellitus  Hyperlipidemia  History of vitamin D deficiency  Anxiety  Remote history of positive PPD  GE reflux  Lumbar  disc disease  Benign microscopic hematuria  History of adenomatous colon polyps with colonoscopy in 2008  Obesity  Plan: Lab work reviewed with patient. Return in 6 months or as needed.  Reminded about diabetic eye exam. Is on low-dose ACE inhibitor for diabetes.  DEXA scan ordered. Hydrocodone refilled. Encouraged diet and exercise.    Subjective:   Patient presents for Medicare Annual/Subsequent preventive examination.  Review Past Medical/Family/Social: see above   Risk Factors  Current exercise habits: Sedentary Dietary issues discussed: Low-fat low carbohydrate  Cardiac risk factors: Hypertension, hyperlipidemia  Depression Screen  (Note: if answer to either of the following is "Yes", a more complete depression screening is indicated)   Over the past two weeks, have you felt down, depressed or hopeless? No  Over the past two weeks, have you felt little interest or pleasure in doing things? No Have you lost interest or pleasure in daily life? No Do you often feel hopeless? No Do you cry easily over simple problems? No   Activities of Daily Living  In your present state of health, do you have any difficulty performing the following activities?:   Driving? No  Managing money? No  Feeding yourself? No  Getting from bed to chair? No  Climbing a flight of stairs? No  Preparing food and eating?: No  Bathing or showering? No  Getting dressed: No  Getting to the toilet? No  Using the toilet:No  Moving around from place to place: No  In the past year have you fallen or had a near fall?:No  Are you sexually active? No  Do you have more than one partner? No   Hearing Difficulties: No  Do you often ask people to speak up or repeat themselves? No  Do you experience ringing or noises in your ears? sometimes Do you have difficulty understanding soft or whispered voices? No  Do you feel that you have a problem with memory? No Do you often misplace items? No    Home Safety:  Do you have a smoke alarm at your residence? Yes Do you have grab bars in the bathroom? no Do you have throw rugs in your house? no   Cognitive Testing  Alert? Yes Normal  Appearance?Yes  Oriented to person? Yes Place? Yes  Time? Yes  Recall of three objects? Yes  Can perform simple calculations? Yes  Displays appropriate judgment?Yes  Can read the correct time from a watch face?Yes   List the Names of Other Physician/Practitioners you currently use:  See referral list for the physicians patient is currently seeing.  Eye physician Urologist yearly Dr. Garwin Brothers [facility]   Review of Systems: See above   Objective:     General appearance: Appears stated age and mildly obese  Head: Normocephalic, without obvious abnormality, atraumatic  Eyes: conj clear, EOMi PEERLA  Ears: normal TM's and external ear canals both ears  Nose: Nares normal. Septum midline. Mucosa normal. No drainage or sinus tenderness.  Throat: lips, mucosa, and tongue normal; teeth and gums normal  Neck: no adenopathy, no carotid bruit, no JVD, supple, symmetrical, trachea midline and thyroid not enlarged, symmetric, no tenderness/mass/nodules  No CVA tenderness.  Lungs: clear to auscultation bilaterally  Breasts: normal appearance, no masses or tenderness Heart: regular rate and rhythm, S1, S2 normal, no murmur, click, rub or gallop  Abdomen: soft, non-tender; bowel sounds normal; no masses, no organomegaly  Musculoskeletal: ROM normal in all joints, no crepitus, no deformity, Normal muscle strengthen. Back  is symmetric, no curvature.  Skin: Skin color, texture, turgor normal. No rashes or lesions  Lymph nodes: Cervical, supraclavicular, and axillary nodes normal.  Neurologic: CN 2 -12 Normal, Normal symmetric reflexes. Normal coordination and gait  Psych: Alert & Oriented x 3, Mood appear stable.    Assessment:    Annual wellness medicare exam   Plan:    During the course of the visit the patient was educated and counseled about appropriate screening and preventive services including:   See above- will have Dexa scan at [facility] order faxed Has glaucoma check q 6 months Mammogram  annually at [facility]     Patient Instructions (the written plan) was given to the patient.  Medicare Attestation  I have personally reviewed:  The patient's medical and social history  Their use of alcohol, tobacco or illicit drugs  Their current medications and supplements  The patient's functional ability including ADLs,fall risks, home safety risks, cognitive, and hearing and visual impairment  Diet and physical activities  Evidence for depression or mood disorders  The patient's weight, height, BMI, and visual acuity have been recorded in the chart. I have made referrals, counseling, and provided education to the patient based on review of the above and I have provided the patient with a written personalized care plan for preventive services.

## 2014-07-15 NOTE — Patient Instructions (Signed)
Dexa scan ordered at [facility]. RTC in 6 months. Meds refilled including hydrocodone for back pain.

## 2014-07-16 LAB — MICROALBUMIN / CREATININE URINE RATIO
CREATININE, URINE: 94.4 mg/dL
Microalb Creat Ratio: 5.3 mg/g (ref 0.0–30.0)
Microalb, Ur: 0.5 mg/dL (ref <2.0)

## 2014-07-16 LAB — VITAMIN D 25 HYDROXY (VIT D DEFICIENCY, FRACTURES): Vit D, 25-Hydroxy: 46 ng/mL (ref 30–100)

## 2014-07-16 LAB — HEMOGLOBIN A1C
Hgb A1c MFr Bld: 6.5 % — ABNORMAL HIGH (ref <5.7)
MEAN PLASMA GLUCOSE: 140 mg/dL — AB (ref <117)

## 2014-07-25 ENCOUNTER — Other Ambulatory Visit: Payer: Self-pay | Admitting: Internal Medicine

## 2014-07-30 DIAGNOSIS — Z780 Asymptomatic menopausal state: Secondary | ICD-10-CM | POA: Diagnosis not present

## 2014-09-05 ENCOUNTER — Other Ambulatory Visit: Payer: Self-pay | Admitting: Internal Medicine

## 2014-12-02 DIAGNOSIS — E119 Type 2 diabetes mellitus without complications: Secondary | ICD-10-CM | POA: Diagnosis not present

## 2014-12-02 DIAGNOSIS — H52223 Regular astigmatism, bilateral: Secondary | ICD-10-CM | POA: Diagnosis not present

## 2014-12-02 DIAGNOSIS — H40013 Open angle with borderline findings, low risk, bilateral: Secondary | ICD-10-CM | POA: Diagnosis not present

## 2014-12-02 DIAGNOSIS — Z9849 Cataract extraction status, unspecified eye: Secondary | ICD-10-CM | POA: Diagnosis not present

## 2015-01-02 ENCOUNTER — Other Ambulatory Visit: Payer: Self-pay | Admitting: Internal Medicine

## 2015-01-02 NOTE — Telephone Encounter (Signed)
Refill x 6 months 

## 2015-01-02 NOTE — Telephone Encounter (Signed)
Phoned to pharmacy 

## 2015-01-08 ENCOUNTER — Other Ambulatory Visit: Payer: Self-pay | Admitting: Internal Medicine

## 2015-01-09 NOTE — Telephone Encounter (Signed)
Sent to pharmacy 

## 2015-01-09 NOTE — Telephone Encounter (Signed)
Refill for once year

## 2015-01-13 ENCOUNTER — Ambulatory Visit: Payer: Medicare Other | Admitting: Internal Medicine

## 2015-01-21 ENCOUNTER — Encounter: Payer: Self-pay | Admitting: Internal Medicine

## 2015-01-21 ENCOUNTER — Ambulatory Visit (INDEPENDENT_AMBULATORY_CARE_PROVIDER_SITE_OTHER): Payer: Medicare Other | Admitting: Internal Medicine

## 2015-01-21 VITALS — BP 148/80 | HR 70 | Temp 98.4°F | Resp 20 | Ht 62.0 in | Wt 190.5 lb

## 2015-01-21 DIAGNOSIS — I10 Essential (primary) hypertension: Secondary | ICD-10-CM | POA: Diagnosis not present

## 2015-01-21 DIAGNOSIS — M549 Dorsalgia, unspecified: Secondary | ICD-10-CM | POA: Diagnosis not present

## 2015-01-21 DIAGNOSIS — E669 Obesity, unspecified: Secondary | ICD-10-CM

## 2015-01-21 DIAGNOSIS — E119 Type 2 diabetes mellitus without complications: Secondary | ICD-10-CM | POA: Diagnosis not present

## 2015-01-21 DIAGNOSIS — Z79899 Other long term (current) drug therapy: Secondary | ICD-10-CM

## 2015-01-21 DIAGNOSIS — E8881 Metabolic syndrome: Secondary | ICD-10-CM

## 2015-01-21 DIAGNOSIS — G8929 Other chronic pain: Secondary | ICD-10-CM

## 2015-01-21 DIAGNOSIS — F411 Generalized anxiety disorder: Secondary | ICD-10-CM | POA: Diagnosis not present

## 2015-01-21 DIAGNOSIS — E785 Hyperlipidemia, unspecified: Secondary | ICD-10-CM

## 2015-01-21 LAB — HEPATIC FUNCTION PANEL
ALK PHOS: 71 U/L (ref 33–130)
ALT: 11 U/L (ref 6–29)
AST: 14 U/L (ref 10–35)
Albumin: 4 g/dL (ref 3.6–5.1)
BILIRUBIN INDIRECT: 0.3 mg/dL (ref 0.2–1.2)
Bilirubin, Direct: 0.1 mg/dL (ref <=0.2)
TOTAL PROTEIN: 6.9 g/dL (ref 6.1–8.1)
Total Bilirubin: 0.4 mg/dL (ref 0.2–1.2)

## 2015-01-21 LAB — LIPID PANEL
CHOL/HDL RATIO: 2.7 ratio (ref <=5.0)
Cholesterol: 145 mg/dL (ref 125–200)
HDL: 54 mg/dL (ref >=46)
LDL Cholesterol: 61 mg/dL (ref <130)
Triglycerides: 150 mg/dL — ABNORMAL HIGH (ref <150)
VLDL: 30 mg/dL (ref <30)

## 2015-01-21 MED ORDER — HYDROCODONE-ACETAMINOPHEN 5-325 MG PO TABS: ORAL_TABLET | ORAL | Status: DC

## 2015-01-21 NOTE — Progress Notes (Signed)
   Subjective:    Patient ID: Leah Barron, female    DOB: 1940-06-03, 75 y.o.   MRN: [id number]  HPI For six-month recheck on hyperlipidemia, hypertension, controlled type 2 diabetes. History of metabolic syndrome and obesity. Blood pressure is elevated today. Has taken blood pressure medications before coming to office about 2 hours previously. Has some issues with right hip pain from time to time. Hydrocodone/APAP refill today once at her request. Anxiety under good control at the present time. Fasting labs drawn today.  Has had recent diabetic eye exam. Immunizations are up-to-date.  Review of Systems no new complaints or problems     Objective:   Physical Exam  Skin warm and dry. Nodes none. Neck is supple without JVD thyromegaly or carotid bruits. Chest clear to auscultation. Cardiac exam regular rate and rhythm normal S1 and S2. Extremities without edema.      Assessment & Plan:    Elevated blood pressure-return in 2 weeks for blood pressure check and office visit. Keep blood pressure checks at home on a daily basis and bring those results at next appointment.  Essential hypertension-see above  Hyperlipidemia-fasting lipid panel pending  Controlled type 2 diabetes mellitus-hemoglobin A1c pending  Obesity  Generalized anxiety disorder stable  Plan return in 2 weeks

## 2015-01-21 NOTE — Patient Instructions (Signed)
Keep blood pressure checks on a daily basis at home and bring readings to next appointment. Return in 2 weeks. Lab work is pending. Immunizations are up-to-date.

## 2015-01-22 LAB — HEMOGLOBIN A1C
Hgb A1c MFr Bld: 6.7 % — ABNORMAL HIGH (ref <5.7)
MEAN PLASMA GLUCOSE: 146 mg/dL — AB (ref <117)

## 2015-02-05 ENCOUNTER — Encounter: Payer: Self-pay | Admitting: Internal Medicine

## 2015-02-05 ENCOUNTER — Ambulatory Visit (INDEPENDENT_AMBULATORY_CARE_PROVIDER_SITE_OTHER): Payer: Medicare Other | Admitting: Internal Medicine

## 2015-02-05 VITALS — BP 142/72 | HR 67 | Temp 98.7°F | Resp 20 | Ht 62.0 in | Wt 192.0 lb

## 2015-02-05 DIAGNOSIS — I10 Essential (primary) hypertension: Secondary | ICD-10-CM

## 2015-02-05 MED ORDER — RAMIPRIL 10 MG PO CAPS: 10.0000 mg | ORAL_CAPSULE | Freq: Every day | ORAL | Status: DC

## 2015-02-05 NOTE — Patient Instructions (Signed)
Continue HCTZ. Increased Ramapril to 10 mg daily and return in 4 weeks.

## 2015-02-05 NOTE — Progress Notes (Signed)
   Subjective:    Patient ID: Leah Barron, female    DOB: 1940-12-11, 75 y.o.   MRN: [id number]  HPI  In  today to follow-up on elevated blood pressure at last visit. She brings in multiple blood pressures  Readings from home. Blood pressure has ranged from  123456 2 [id number] systolically. Generally ranging from 0000000 diastolically. Says she was aggravated today because her car would not start. Thinks blood pressure is elevated today because of that. However it seems a blood pressure is trending in the 140s most of the time.    Review of Systems     Objective:   Physical Exam   chest clear. Cardiac exam regular rate and rhythm. Extremities without edema.      Assessment & Plan:   Essential hypertension  Plan: Patient is on HCTZ and Ramapril  5 mg daily. Increase Ramipril to 10 mg daily and return in 4 weeks for office visit blood pressure check and basic metabolic panel

## 2015-03-03 ENCOUNTER — Other Ambulatory Visit: Payer: Medicare Other | Admitting: Internal Medicine

## 2015-03-03 DIAGNOSIS — I10 Essential (primary) hypertension: Secondary | ICD-10-CM

## 2015-03-03 LAB — BASIC METABOLIC PANEL
BUN: 15 mg/dL (ref 7–25)
CHLORIDE: 104 mmol/L (ref 98–110)
CO2: 26 mmol/L (ref 20–31)
Calcium: 9.5 mg/dL (ref 8.6–10.4)
Creat: 0.92 mg/dL (ref 0.60–0.93)
Glucose, Bld: 147 mg/dL — ABNORMAL HIGH (ref 65–99)
POTASSIUM: 3.9 mmol/L (ref 3.5–5.3)
SODIUM: 140 mmol/L (ref 135–146)

## 2015-03-05 ENCOUNTER — Encounter: Payer: Self-pay | Admitting: Internal Medicine

## 2015-03-05 ENCOUNTER — Other Ambulatory Visit: Payer: Self-pay | Admitting: Internal Medicine

## 2015-03-05 ENCOUNTER — Ambulatory Visit (INDEPENDENT_AMBULATORY_CARE_PROVIDER_SITE_OTHER): Payer: Medicare Other | Admitting: Internal Medicine

## 2015-03-05 VITALS — BP 132/84 | HR 78 | Temp 98.6°F | Resp 18 | Ht 62.0 in | Wt 188.0 lb

## 2015-03-05 DIAGNOSIS — F411 Generalized anxiety disorder: Secondary | ICD-10-CM | POA: Diagnosis not present

## 2015-03-05 DIAGNOSIS — I10 Essential (primary) hypertension: Secondary | ICD-10-CM

## 2015-03-05 DIAGNOSIS — J069 Acute upper respiratory infection, unspecified: Secondary | ICD-10-CM | POA: Diagnosis not present

## 2015-03-05 DIAGNOSIS — G44209 Tension-type headache, unspecified, not intractable: Secondary | ICD-10-CM | POA: Diagnosis not present

## 2015-03-05 MED ORDER — METHYLPREDNISOLONE ACETATE 80 MG/ML IJ SUSP
80.0000 mg | Freq: Once | INTRAMUSCULAR | Status: AC
  Administered 2015-03-05: 80 mg via INTRAMUSCULAR

## 2015-03-05 MED ORDER — AMOXICILLIN 500 MG PO CAPS: 500.0000 mg | ORAL_CAPSULE | Freq: Three times a day (TID) | ORAL | Status: DC

## 2015-03-05 NOTE — Patient Instructions (Addendum)
Amoxicillin 500 mg 3 times daily for 10 days. Depo-Medrol 80 mg IM. Continue Ramipril 10 mg daily and follow-up after July 12 for physical exam. Xanax refilled for 6 months. Take Tylenol or Advil for headache.

## 2015-03-05 NOTE — Progress Notes (Signed)
   Subjective:    Patient ID: Leah Barron, female    DOB: April 12, 1940, 75 y.o.   MRN: [id number]  HPI URI onset about a month. Has had persistence phlegm in throat. No fever or shaking chills. Was really here to see about BP control after increasing Ramapril from 5-10 mg daily at last visit and brought up additional problem today. B met drawn before this visit with increased dose of Ramipril is normal. Does not think Ramipril was causing cough. Also complaining of slight headache for the past 2-3 days. Says sometimes blood pressure is 709 systolically when she first begins to take it but it will gradually come down to the 1:30 range. I took it twice today and it was in the 628  range systolically each time    Review of Systems as above     Objective:   Physical Exam  Skin warm and dry. Nodes none. Neck is supple without JVD thyromegaly or carotid bruits. Chest clear to auscultation. Cardiac exam regular rate and rhythm normal S1 and S2. TMs and pharynx are clear. Neck is supple. No lower extremity edema.      Assessment & Plan:  Acute URI  Headache  Essential hypertension  Anxiety-says she needs Xanax refilled  Plan: To for physical exam after mid July 2017. Depo-Medrol 80 mg IM. Amoxicillin 500 mg 3 times daily for 10 days. May take Tylenol or Advil for headache. Continue to monitor blood pressure at home and continue same medication including Ramapril 10 mg daily.

## 2015-03-19 ENCOUNTER — Other Ambulatory Visit: Payer: Self-pay | Admitting: Internal Medicine

## 2015-04-02 ENCOUNTER — Other Ambulatory Visit: Payer: Self-pay | Admitting: Internal Medicine

## 2015-04-02 NOTE — Telephone Encounter (Signed)
Refill for one year 

## 2015-04-06 ENCOUNTER — Other Ambulatory Visit: Payer: Self-pay | Admitting: Internal Medicine

## 2015-04-14 ENCOUNTER — Other Ambulatory Visit: Payer: Self-pay | Admitting: Internal Medicine

## 2015-04-14 NOTE — Telephone Encounter (Signed)
Refill x 6 months 

## 2015-05-13 ENCOUNTER — Encounter: Payer: Self-pay | Admitting: Internal Medicine

## 2015-05-13 DIAGNOSIS — Z803 Family history of malignant neoplasm of breast: Secondary | ICD-10-CM | POA: Diagnosis not present

## 2015-05-13 DIAGNOSIS — Z1231 Encounter for screening mammogram for malignant neoplasm of breast: Secondary | ICD-10-CM | POA: Diagnosis not present

## 2015-05-13 LAB — HM MAMMOGRAPHY: HM Mammogram: NORMAL (ref 0–4)

## 2015-05-15 ENCOUNTER — Encounter: Payer: Self-pay | Admitting: Internal Medicine

## 2015-06-09 DIAGNOSIS — H40023 Open angle with borderline findings, high risk, bilateral: Secondary | ICD-10-CM | POA: Diagnosis not present

## 2015-07-16 ENCOUNTER — Other Ambulatory Visit: Payer: Self-pay | Admitting: Internal Medicine

## 2015-07-20 ENCOUNTER — Ambulatory Visit (INDEPENDENT_AMBULATORY_CARE_PROVIDER_SITE_OTHER): Payer: Medicare Other | Admitting: Internal Medicine

## 2015-07-20 ENCOUNTER — Encounter: Payer: Self-pay | Admitting: Internal Medicine

## 2015-07-20 VITALS — BP 124/70 | HR 77 | Temp 97.7°F | Resp 15 | Ht 62.0 in | Wt 184.0 lb

## 2015-07-20 DIAGNOSIS — Z1329 Encounter for screening for other suspected endocrine disorder: Secondary | ICD-10-CM

## 2015-07-20 DIAGNOSIS — E559 Vitamin D deficiency, unspecified: Secondary | ICD-10-CM

## 2015-07-20 DIAGNOSIS — E8881 Metabolic syndrome: Secondary | ICD-10-CM | POA: Diagnosis not present

## 2015-07-20 DIAGNOSIS — E785 Hyperlipidemia, unspecified: Secondary | ICD-10-CM | POA: Diagnosis not present

## 2015-07-20 DIAGNOSIS — F411 Generalized anxiety disorder: Secondary | ICD-10-CM | POA: Diagnosis not present

## 2015-07-20 DIAGNOSIS — I10 Essential (primary) hypertension: Secondary | ICD-10-CM | POA: Diagnosis not present

## 2015-07-20 DIAGNOSIS — M545 Low back pain, unspecified: Secondary | ICD-10-CM

## 2015-07-20 DIAGNOSIS — Z0000 Encounter for general adult medical examination without abnormal findings: Secondary | ICD-10-CM | POA: Diagnosis not present

## 2015-07-20 DIAGNOSIS — H6123 Impacted cerumen, bilateral: Secondary | ICD-10-CM

## 2015-07-20 DIAGNOSIS — E119 Type 2 diabetes mellitus without complications: Secondary | ICD-10-CM | POA: Diagnosis not present

## 2015-07-20 LAB — CBC WITH DIFFERENTIAL/PLATELET
BASOS PCT: 0 %
Basophils Absolute: 0 cells/uL (ref 0–200)
EOS PCT: 0 %
Eosinophils Absolute: 0 cells/uL — ABNORMAL LOW (ref 15–500)
HEMATOCRIT: 39.7 % (ref 35.0–45.0)
HEMOGLOBIN: 13.1 g/dL (ref 11.7–15.5)
LYMPHS ABS: 2100 {cells}/uL (ref 850–3900)
Lymphocytes Relative: 42 %
MCH: 30.3 pg (ref 27.0–33.0)
MCHC: 33 g/dL (ref 32.0–36.0)
MCV: 91.7 fL (ref 80.0–100.0)
MONO ABS: 350 {cells}/uL (ref 200–950)
MPV: 8.9 fL (ref 7.5–12.5)
Monocytes Relative: 7 %
NEUTROS ABS: 2550 {cells}/uL (ref 1500–7800)
NEUTROS PCT: 51 %
Platelets: 278 10*3/uL (ref 140–400)
RBC: 4.33 MIL/uL (ref 3.80–5.10)
RDW: 14 % (ref 11.0–15.0)
WBC: 5 10*3/uL (ref 3.8–10.8)

## 2015-07-20 LAB — LIPID PANEL
CHOL/HDL RATIO: 2.5 ratio (ref <=5.0)
Cholesterol: 143 mg/dL (ref 125–200)
HDL: 58 mg/dL (ref >=46)
LDL Cholesterol: 57 mg/dL (ref <130)
Triglycerides: 142 mg/dL (ref <150)
VLDL: 28 mg/dL (ref <30)

## 2015-07-20 LAB — COMPLETE METABOLIC PANEL WITH GFR
ALBUMIN: 4 g/dL (ref 3.6–5.1)
ALK PHOS: 68 U/L (ref 33–130)
ALT: 12 U/L (ref 6–29)
AST: 14 U/L (ref 10–35)
BUN: 18 mg/dL (ref 7–25)
CALCIUM: 9.7 mg/dL (ref 8.6–10.4)
CHLORIDE: 105 mmol/L (ref 98–110)
CO2: 26 mmol/L (ref 20–31)
CREATININE: 1.01 mg/dL — AB (ref 0.60–0.93)
GFR, Est African American: 63 mL/min (ref >=60)
GFR, Est Non African American: 55 mL/min — ABNORMAL LOW (ref >=60)
GLUCOSE: 107 mg/dL — AB (ref 65–99)
POTASSIUM: 3.8 mmol/L (ref 3.5–5.3)
SODIUM: 142 mmol/L (ref 135–146)
Total Bilirubin: 0.5 mg/dL (ref 0.2–1.2)
Total Protein: 6.8 g/dL (ref 6.1–8.1)

## 2015-07-20 LAB — POCT URINALYSIS DIPSTICK
BILIRUBIN UA: NEGATIVE
Blood, UA: NEGATIVE
Glucose, UA: NEGATIVE
KETONES UA: NEGATIVE
LEUKOCYTES UA: NEGATIVE
Nitrite, UA: NEGATIVE
Protein, UA: NEGATIVE
SPEC GRAV UA: 1.02
Urobilinogen, UA: 0.2
pH, UA: 6.5

## 2015-07-20 LAB — HEMOGLOBIN A1C
HEMOGLOBIN A1C: 6.3 % — AB (ref <5.7)
Mean Plasma Glucose: 134 mg/dL

## 2015-07-20 LAB — TSH: TSH: 0.7 m[IU]/L

## 2015-07-20 MED ORDER — ALPRAZOLAM 0.5 MG PO TABS: 0.5000 mg | ORAL_TABLET | Freq: Two times a day (BID) | ORAL | Status: DC

## 2015-07-20 MED ORDER — HYDROCODONE-ACETAMINOPHEN 5-325 MG PO TABS: ORAL_TABLET | ORAL | Status: DC

## 2015-07-20 MED ORDER — AMOXICILLIN 500 MG PO CAPS: 500.0000 mg | ORAL_CAPSULE | Freq: Three times a day (TID) | ORAL | Status: DC

## 2015-07-21 LAB — VITAMIN D 25 HYDROXY (VIT D DEFICIENCY, FRACTURES): VIT D 25 HYDROXY: 49 ng/mL (ref 30–100)

## 2015-07-21 LAB — MICROALBUMIN, URINE: Microalb, Ur: 0.9 mg/dL

## 2015-08-02 ENCOUNTER — Encounter: Payer: Self-pay | Admitting: Internal Medicine

## 2015-08-02 DIAGNOSIS — I10 Essential (primary) hypertension: Secondary | ICD-10-CM | POA: Insufficient documentation

## 2015-08-02 NOTE — Patient Instructions (Signed)
It was a pleasure to see you today. Continue same medications and return in 6 months. Keep blood pressure.

## 2015-08-02 NOTE — Progress Notes (Signed)
Subjective:    Patient ID: Leah Barron, female    DOB: [birth year], 75 y.o.   MRN: [id number]  HPI 75 year old Black  Female in today for health maintenance exam and evaluation of medical issues. Had diabetic eye exam in early June. She has a history of controlled type 2 diabetes mellitus, history of vitamin D deficiency, metabolic syndrome, back pain, obesity, hyperlipidemia, microscopic hematuria previously evaluated by urologist, GE reflux, history of adenomatous colon polyps, and anxiety. History of lumbar disc herniation L5-S1.  Patient denies noncompliance with medications.  Past medical history cholecystectomy 1998, hysterectomy without oophorectomy 1981, left breast biopsy 1978, bladder biopsies for microscopic hematuria in [place] both of which were benign. In 1996 she suffered a left ankle fracture secondary to a fall. History of recurrent low back pain related to lumbar disc disease.  Colonoscopy 2008 graft intolerant of codeine. Intolerant of Parafon Forte to a as it is calls possible adverse reaction in the past. She is able to take hydrocodone.  Social history: She is a widow. Husband died of complications from cirrhosis of the liver. One daughter with history of HIV doing well. One daughter with history of mental illness. Patient does not smoke or consume alcohol. 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  Family history: Total of 6 brothers. One brother with history of hypertension and kidney failure. One brother with history of diabetes mellitus coronary artery disease and mental illness. One brother died with lung cancer. One sister died with diabetes mellitus heart and kidney failure. One sister died of cardiac arrest.    Review of Systems  Constitutional: Positive for fatigue.  Respiratory: Negative.   Cardiovascular: Negative.   Gastrointestinal: Negative.   Genitourinary: Negative.   Musculoskeletal: Positive for back pain.  Neurological: Negative.     Psychiatric/Behavioral:       History of anxiety       Objective:   Physical Exam  Constitutional: She is oriented to person, place, and time. She appears well-developed and well-nourished. No distress.  HENT:  Head: Normocephalic and atraumatic.  Right Ear: External ear normal.  Left Ear: External ear normal.  Mouth/Throat: Oropharynx is clear and moist. No oropharyngeal exudate.  Cerumen removed from both ear canals.  Eyes: Conjunctivae and EOM are normal. Pupils are equal, round, and reactive to light. Right eye exhibits no discharge. Left eye exhibits no discharge. No scleral icterus.  Neck: Neck supple. No JVD present. No thyromegaly present.  Cardiovascular: Normal rate, regular rhythm, normal heart sounds and intact distal pulses.   No murmur heard. Pulmonary/Chest: No respiratory distress. She has no wheezes. She has no rales.  Breasts normal female  Abdominal: Soft. Bowel sounds are normal. She exhibits no distension and no mass. There is no tenderness. There is no rebound and no guarding.  Genitourinary:  Genitourinary Comments: Deferred to GYN physician  Musculoskeletal: She exhibits no edema.  Lymphadenopathy:    She has no cervical adenopathy.  Neurological: She is alert and oriented to person, place, and time. She has normal reflexes. She displays normal reflexes. No cranial nerve deficit. Coordination normal.  Skin: Skin is warm and dry. No rash noted. She is not diaphoretic.  Psychiatric: She has a normal mood and affect. Her behavior is normal. Judgment and thought content normal.  Vitals reviewed.         Assessment & Plan:  Normal health maintenance exam  Controlled type 2 diabetes mellitus-hemoglobin A1c 6.3% and previously was 6.7%  Essential hypertension  Bilateral impacted cerumen  Hyperlipidemia  History of vitamin D deficiency-vitamin D level is normal  Anxiety  Remote history of positive PPD  GE reflux  Lumbar disc disease  Benign  microscopic hematuria  History of adenomatous polyps with colonoscopy in 2008  Obesity  Plan: Continue same medications and return in 6 months. Patient should check with GI physician regarding possible repeat colonoscopy. Medical issues reviewed and are stable at this point in time.  Subjective:   Patient presents for Medicare Annual/Subsequent preventive examination.  Review Past Medical/Family/Social:See above   Risk Factors  Current exercise habits: Sedentary Dietary issues discussed: Low fat low carbohydrate  Cardiac risk factors:Diabetes in family history  Depression Screen  (Note: if answer to either of the following is "Yes", a more complete depression screening is indicated)   Over the past two weeks, have you felt down, depressed or hopeless? No  Over the past two weeks, have you felt little interest or pleasure in doing things? No Have you lost interest or pleasure in daily life? No Do you often feel hopeless? No Do you cry easily over simple problems? No   Activities of Daily Living  In your present state of health, do you have any difficulty performing the following activities?:   Driving? No  Managing money? No  Feeding yourself? No  Getting from bed to chair? No  Climbing a flight of stairs? No  Preparing food and eating?: No  Bathing or showering? No  Getting dressed: No  Getting to the toilet? No  Using the toilet:No  Moving around from place to place: No  In the past year have you fallen or had a near fall?:No  Are you sexually active? No  Do you have more than one partner? No   Hearing Difficulties: No  Do you often ask people to speak up or repeat themselves? Yes right ear is stopped up Do you experience ringing or noises in your ears? Yes and right ear Do you have difficulty understanding soft or whispered voices? Some Do you feel that you have a problem with memory? Sometimes Do you often misplace items? Sometimes   Home Safety:  Do you  have a smoke alarm at your residence? Yes Do you have grab bars in the bathroom?No Do you have throw rugs in your house? No   Cognitive Testing  Alert? Yes Normal Appearance?Yes  Oriented to person? Yes Place? Yes  Time? Yes  Recall of three objects? Yes  Can perform simple calculations? Yes  Displays appropriate judgment?Yes  Can read the correct time from a watch face?Yes   List the Names of Other Physician/Practitioners you currently use:  See referral list for the physicians patient is currently seeing.     Review of Systems: See above   Objective:     General appearance: Appears stated age and mildly obese  Head: Normocephalic, without obvious abnormality, atraumatic  Eyes: conj clear, EOMi PEERLA  Ears: normal TM's and external ear canals both ears  Nose: Nares normal. Septum midline. Mucosa normal. No drainage or sinus tenderness.  Throat: lips, mucosa, and tongue normal; teeth and gums normal  Neck: no adenopathy, no carotid bruit, no JVD, supple, symmetrical, trachea midline and thyroid not enlarged, symmetric, no tenderness/mass/nodules  No CVA tenderness.  Lungs: clear to auscultation bilaterally  Breasts: normal appearance, no masses or tenderness Heart: regular rate and rhythm, S1, S2 normal, no murmur, click, rub or gallop  Abdomen: soft, non-tender; bowel sounds normal; no masses, no organomegaly  Musculoskeletal: ROM normal in all joints, no crepitus, no deformity, Normal muscle strengthen. Back  is symmetric, no curvature. Skin: Skin color, texture, turgor normal. No rashes or lesions  Lymph nodes: Cervical, supraclavicular, and axillary nodes normal.  Neurologic: CN 2 -12 Normal, Normal symmetric reflexes. Normal coordination and gait  Psych: Alert & Oriented x 3, Mood appear stable.    Assessment:    Annual wellness medicare exam   Plan:    During the course of the visit the patient was educated and counseled about appropriate screening and  preventive services including:   Annual mammogram  Annual flu vaccine     Patient Instructions (the written plan) was given to the patient.  Medicare Attestation  I have personally reviewed:  The patient's medical and social history  Their use of alcohol, tobacco or illicit drugs  Their current medications and supplements  The patient's functional ability including ADLs,fall risks, home safety risks, cognitive, and hearing and visual impairment  Diet and physical activities  Evidence for depression or mood disorders  The patient's weight, height, BMI, and visual acuity have been recorded in the chart. I have made referrals, counseling, and provided education to the patient based on review of the above and I have provided the patient with a written personalized care plan for preventive services.

## 2015-08-13 DIAGNOSIS — N83201 Unspecified ovarian cyst, right side: Secondary | ICD-10-CM | POA: Diagnosis not present

## 2015-10-05 ENCOUNTER — Other Ambulatory Visit: Payer: Self-pay | Admitting: *Deleted

## 2015-10-05 MED ORDER — ULTILET CLASSIC LANCETS MISC: 12 refills | Status: DC

## 2015-10-12 ENCOUNTER — Other Ambulatory Visit: Payer: Self-pay | Admitting: Internal Medicine

## 2015-12-17 DIAGNOSIS — E119 Type 2 diabetes mellitus without complications: Secondary | ICD-10-CM | POA: Diagnosis not present

## 2015-12-17 DIAGNOSIS — H40013 Open angle with borderline findings, low risk, bilateral: Secondary | ICD-10-CM | POA: Diagnosis not present

## 2015-12-17 DIAGNOSIS — H52223 Regular astigmatism, bilateral: Secondary | ICD-10-CM | POA: Diagnosis not present

## 2015-12-17 LAB — HM DIABETES EYE EXAM

## 2016-01-13 ENCOUNTER — Encounter: Payer: Self-pay | Admitting: Internal Medicine

## 2016-01-19 ENCOUNTER — Encounter: Payer: Self-pay | Admitting: Internal Medicine

## 2016-01-19 ENCOUNTER — Ambulatory Visit (INDEPENDENT_AMBULATORY_CARE_PROVIDER_SITE_OTHER): Payer: Medicare Other | Admitting: Internal Medicine

## 2016-01-19 VITALS — BP 112/80 | HR 65 | Wt 180.0 lb

## 2016-01-19 DIAGNOSIS — E119 Type 2 diabetes mellitus without complications: Secondary | ICD-10-CM | POA: Diagnosis not present

## 2016-01-19 DIAGNOSIS — I10 Essential (primary) hypertension: Secondary | ICD-10-CM | POA: Diagnosis not present

## 2016-01-19 DIAGNOSIS — G8929 Other chronic pain: Secondary | ICD-10-CM | POA: Diagnosis not present

## 2016-01-19 DIAGNOSIS — Z792 Long term (current) use of antibiotics: Secondary | ICD-10-CM | POA: Diagnosis not present

## 2016-01-19 DIAGNOSIS — E8881 Metabolic syndrome: Secondary | ICD-10-CM

## 2016-01-19 DIAGNOSIS — Z79899 Other long term (current) drug therapy: Secondary | ICD-10-CM | POA: Diagnosis not present

## 2016-01-19 DIAGNOSIS — E784 Other hyperlipidemia: Secondary | ICD-10-CM | POA: Diagnosis not present

## 2016-01-19 DIAGNOSIS — M545 Low back pain: Secondary | ICD-10-CM

## 2016-01-19 DIAGNOSIS — E7849 Other hyperlipidemia: Secondary | ICD-10-CM

## 2016-01-19 DIAGNOSIS — F411 Generalized anxiety disorder: Secondary | ICD-10-CM | POA: Diagnosis not present

## 2016-01-19 LAB — LIPID PANEL
Cholesterol: 148 mg/dL (ref <200)
HDL: 57 mg/dL (ref >50)
LDL CALC: 62 mg/dL (ref <100)
Total CHOL/HDL Ratio: 2.6 Ratio (ref <5.0)
Triglycerides: 145 mg/dL (ref <150)
VLDL: 29 mg/dL (ref <30)

## 2016-01-19 LAB — HEPATIC FUNCTION PANEL
ALT: 9 U/L (ref 6–29)
AST: 12 U/L (ref 10–35)
Albumin: 4.2 g/dL (ref 3.6–5.1)
Alkaline Phosphatase: 73 U/L (ref 33–130)
BILIRUBIN DIRECT: 0.1 mg/dL (ref <=0.2)
BILIRUBIN TOTAL: 0.6 mg/dL (ref 0.2–1.2)
Indirect Bilirubin: 0.5 mg/dL (ref 0.2–1.2)
Total Protein: 7.1 g/dL (ref 6.1–8.1)

## 2016-01-19 MED ORDER — ALPRAZOLAM 0.5 MG PO TABS: 0.5000 mg | ORAL_TABLET | Freq: Two times a day (BID) | ORAL | 5 refills | Status: DC

## 2016-01-19 MED ORDER — HYDROCODONE-ACETAMINOPHEN 5-325 MG PO TABS: ORAL_TABLET | ORAL | 0 refills | Status: DC

## 2016-01-19 NOTE — Patient Instructions (Signed)
It was a pleasure to see you today. Continue same medications and return in 6 months. Norco refilled as well as alprazolam.

## 2016-01-19 NOTE — Progress Notes (Signed)
   Subjective:    Patient ID: Leah Barron, female    DOB: 29-Jun-1940, 76 y.o.   MRN: [id number]  HPI  76 year old Black Female With history of controlled type 2 diabetes mellitus, hypertension and hyperlipidemia.  Fasting lab studies drawn today and are pending.  Has had flu vaccine at local pharmacy.  Needs refill on alprazolam and Norco. Generally makes Norco prescription last about 6 months. She takes this for chronic recurrent low back pain sparingly. History of generalized anxiety disorder treated with Xanax sparingly.  No new complaints.   Review of Systems see above     Objective:   Physical Exam Skin: warm and dry. Nodes none. Neck is supple without JVD thyromegaly or carotid bruits. Chest clear to auscultation. Cardiac exam regular rate and rhythm normal S1 and S2. Extremity is without edema. Diabetic foot exam shows pulses to be present bilaterally in the feet. No ulcers or calluses.       Assessment & Plan:  Controlled type 2 diabetes mellitus-hemoglobin A1c pending  Essential hypertension-stable  Hyperlipidemia-lipid panel liver functions pending  Metabolic syndrome  Plan: Refilled alprazolam and Norco today by written prescription. Return in 6 months for physical examination.

## 2016-01-20 LAB — HEMOGLOBIN A1C
HEMOGLOBIN A1C: 6 % — AB (ref <5.7)
MEAN PLASMA GLUCOSE: 126 mg/dL

## 2016-01-20 LAB — MICROALBUMIN / CREATININE URINE RATIO
CREATININE, URINE: 56 mg/dL (ref 20–320)
MICROALB UR: 1.3 mg/dL
MICROALB/CREAT RATIO: 23 ug/mg{creat} (ref <30)

## 2016-02-22 ENCOUNTER — Other Ambulatory Visit: Payer: Self-pay | Admitting: Internal Medicine

## 2016-03-22 ENCOUNTER — Other Ambulatory Visit: Payer: Self-pay

## 2016-03-22 ENCOUNTER — Other Ambulatory Visit: Payer: Self-pay | Admitting: Internal Medicine

## 2016-03-22 MED ORDER — OMEPRAZOLE 40 MG PO CPDR: 40.0000 mg | DELAYED_RELEASE_CAPSULE | Freq: Every day | ORAL | 3 refills | Status: DC

## 2016-03-22 MED ORDER — ATORVASTATIN CALCIUM 10 MG PO TABS: 10.0000 mg | ORAL_TABLET | Freq: Every day | ORAL | 3 refills | Status: DC

## 2016-03-25 ENCOUNTER — Other Ambulatory Visit: Payer: Self-pay

## 2016-03-25 MED ORDER — METFORMIN HCL 500 MG PO TABS: 500.0000 mg | ORAL_TABLET | Freq: Two times a day (BID) | ORAL | 3 refills | Status: DC

## 2016-04-06 ENCOUNTER — Other Ambulatory Visit: Payer: Self-pay

## 2016-04-06 MED ORDER — RAMIPRIL 10 MG PO CAPS: 10.0000 mg | ORAL_CAPSULE | Freq: Every day | ORAL | 11 refills | Status: DC

## 2016-04-06 MED ORDER — HYDROCHLOROTHIAZIDE 25 MG PO TABS: 25.0000 mg | ORAL_TABLET | Freq: Every day | ORAL | 3 refills | Status: DC

## 2016-04-06 MED ORDER — METFORMIN HCL 500 MG PO TABS: 500.0000 mg | ORAL_TABLET | Freq: Two times a day (BID) | ORAL | 3 refills | Status: DC

## 2016-05-05 ENCOUNTER — Other Ambulatory Visit: Payer: Self-pay | Admitting: Internal Medicine

## 2016-05-17 DIAGNOSIS — Z1231 Encounter for screening mammogram for malignant neoplasm of breast: Secondary | ICD-10-CM | POA: Diagnosis not present

## 2016-05-17 LAB — HM MAMMOGRAPHY: HM Mammogram: NORMAL (ref 0–4)

## 2016-05-18 ENCOUNTER — Encounter: Payer: Self-pay | Admitting: Internal Medicine

## 2016-06-21 ENCOUNTER — Encounter: Payer: Self-pay | Admitting: Internal Medicine

## 2016-06-21 DIAGNOSIS — H40023 Open angle with borderline findings, high risk, bilateral: Secondary | ICD-10-CM | POA: Diagnosis not present

## 2016-06-21 DIAGNOSIS — H40053 Ocular hypertension, bilateral: Secondary | ICD-10-CM | POA: Diagnosis not present

## 2016-06-21 LAB — HM DIABETES EYE EXAM

## 2016-06-23 DIAGNOSIS — H9311 Tinnitus, right ear: Secondary | ICD-10-CM | POA: Diagnosis not present

## 2016-06-23 DIAGNOSIS — H6122 Impacted cerumen, left ear: Secondary | ICD-10-CM | POA: Diagnosis not present

## 2016-07-05 ENCOUNTER — Encounter: Payer: Self-pay | Admitting: Internal Medicine

## 2016-07-21 ENCOUNTER — Encounter: Payer: Self-pay | Admitting: Internal Medicine

## 2016-07-21 DIAGNOSIS — Z1329 Encounter for screening for other suspected endocrine disorder: Secondary | ICD-10-CM | POA: Diagnosis not present

## 2016-07-21 DIAGNOSIS — I10 Essential (primary) hypertension: Secondary | ICD-10-CM | POA: Diagnosis not present

## 2016-07-21 DIAGNOSIS — E785 Hyperlipidemia, unspecified: Secondary | ICD-10-CM | POA: Diagnosis not present

## 2016-07-26 ENCOUNTER — Encounter: Payer: Self-pay | Admitting: Internal Medicine

## 2016-07-26 ENCOUNTER — Ambulatory Visit (INDEPENDENT_AMBULATORY_CARE_PROVIDER_SITE_OTHER): Payer: Medicare Other | Admitting: Internal Medicine

## 2016-07-26 VITALS — BP 130/80 | HR 66 | Temp 98.2°F | Ht 60.5 in | Wt 180.0 lb

## 2016-07-26 DIAGNOSIS — Z0000 Encounter for general adult medical examination without abnormal findings: Secondary | ICD-10-CM

## 2016-07-26 DIAGNOSIS — E784 Other hyperlipidemia: Secondary | ICD-10-CM | POA: Diagnosis not present

## 2016-07-26 DIAGNOSIS — E7849 Other hyperlipidemia: Secondary | ICD-10-CM

## 2016-07-26 DIAGNOSIS — E8881 Metabolic syndrome: Secondary | ICD-10-CM | POA: Diagnosis not present

## 2016-07-26 DIAGNOSIS — E119 Type 2 diabetes mellitus without complications: Secondary | ICD-10-CM

## 2016-07-26 DIAGNOSIS — G8929 Other chronic pain: Secondary | ICD-10-CM | POA: Diagnosis not present

## 2016-07-26 DIAGNOSIS — M545 Low back pain, unspecified: Secondary | ICD-10-CM

## 2016-07-26 DIAGNOSIS — F419 Anxiety disorder, unspecified: Secondary | ICD-10-CM

## 2016-07-26 DIAGNOSIS — Z6834 Body mass index (BMI) 34.0-34.9, adult: Secondary | ICD-10-CM

## 2016-07-26 DIAGNOSIS — I10 Essential (primary) hypertension: Secondary | ICD-10-CM | POA: Diagnosis not present

## 2016-07-26 LAB — POCT URINALYSIS DIPSTICK
Bilirubin, UA: NEGATIVE
Glucose, UA: NEGATIVE
KETONES UA: NEGATIVE
LEUKOCYTES UA: NEGATIVE
NITRITE UA: NEGATIVE
PH UA: 7 (ref 5.0–8.0)
PROTEIN UA: NEGATIVE
Spec Grav, UA: 1.02 (ref 1.010–1.025)
UROBILINOGEN UA: 0.2 U/dL

## 2016-07-26 MED ORDER — IBUPROFEN 800 MG PO TABS: 800.0000 mg | ORAL_TABLET | Freq: Three times a day (TID) | ORAL | 5 refills | Status: DC

## 2016-07-26 MED ORDER — ALPRAZOLAM 0.5 MG PO TABS: 0.5000 mg | ORAL_TABLET | Freq: Two times a day (BID) | ORAL | 5 refills | Status: DC

## 2016-07-26 NOTE — Patient Instructions (Addendum)
It is a pleasure to see you today. Continue to work on diet exercise and weight loss. Continue same medications and return in 6 months.

## 2016-07-26 NOTE — Progress Notes (Signed)
Subjective:    Patient ID: Leah Barron, female    DOB: [birth year], 76 y.o.   MRN: [id number]  HPI  76 year old Female for health maintenance exam and evaluation of medical issues in addition to Medicare wellness exam.  She had lab work drawn through [facility] recently. CBC is normal, fasting glucose 113. BUN and creatinine are normal. Fasting lipid panel entirely within normal limits. Hemoglobin A1c 6.1% and TSH is normal.  She has moved. She was living alone near [place] and is now moved in with her daughter and son-in-law near [place].  She has a history of controlled type 2 diabetes mellitus, history of vitamin D deficiency, metabolic syndrome, back pain, obesity, hyperlipidemia, microscopic hematuria previously evaluated by urologist, GE reflux, history of adenomatous colon polyps and anxiety. History of lumbar disc herniation L5-S1.  Patient is compliant with her medications.  She requested not to be tested for vitamin D deficiency because of expense  Past medical history: Cholecystectomy 1998, hysterectomy without oophorectomy 1981, left breast biopsy 1978, bladder biopsies for microscopic hematuria in [place] both of which were benign. In 1996, she suffered a left ankle injury secondary to a fall. History recurrent low back pain related to lumbar disc disease.  Colonoscopy in 2008.  She is intolerant of codeine. Intolerant of Parafon Forte day as it caused possible adverse reaction in the past. Is able to take hydrocodone.  Social history: She is a widow. Husband died of complications from cirrhosis of the liver. One daughter with history of HIV doing well. One daughter with history of mental illness. Patient does not smoke or consume alcohol. 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  Family history: Total of 6 brothers. One brother with history of hypertension and kidney failure. One brother with history of diabetes mellitus, coronary artery disease and  mental illness. One brother died with lung cancer. One sister died with diabetes mellitus, heart and kidney failure. One sister died of cardiac arrest.    Review of Systems history of anxiety and occasional low back pain     Objective:   Physical Exam  Constitutional: She is oriented to person, place, and time. She appears well-developed and well-nourished. No distress.  HENT:  Head: Normocephalic and atraumatic.  Right Ear: External ear normal.  Left Ear: External ear normal.  Mouth/Throat: Oropharynx is clear and moist. No oropharyngeal exudate.  Eyes: Pupils are equal, round, and reactive to light. Conjunctivae and EOM are normal. Right eye exhibits no discharge. Left eye exhibits no discharge. No scleral icterus.  Neck: Neck supple. No JVD present. No thyromegaly present.  Cardiovascular: Normal rate, regular rhythm, normal heart sounds and intact distal pulses.   No murmur heard. Pulmonary/Chest: Effort normal and breath sounds normal. She has no wheezes.  Abdominal: Soft. Bowel sounds are normal. She exhibits no distension and no mass. There is no tenderness. There is no rebound and no guarding.  Genitourinary:  Genitourinary Comments: Deferred to GYN. Pap usually not done after age 45  Musculoskeletal: She exhibits no edema.  Lymphadenopathy:    She has no cervical adenopathy.  Neurological: She is alert and oriented to person, place, and time. She has normal reflexes. No cranial nerve deficit. Coordination normal.  Skin: Skin is warm and dry. No rash noted. She is not diaphoretic.  Psychiatric: She has a normal mood and affect. Her behavior is normal. Thought content normal.  Vitals reviewed.         Assessment &  Plan:   Essential hypertension-stable on current regimen. Blood pressure was initially elevated when she first got here but she says she was nervous from driving. Blood pressure stable in office today and she will continue to monitor it at home.  Controlled  type 2 diabetes mellitus with hemoglobin A1c stable  Hyperlipidemia-lipid panel and liver functions are stable  Obesity-she has chronic low back pain and really can't exercise. Continue to watch diet. She weighs 180 pounds today and last year weighed 184 pounds time of her physical exam.  Metabolic syndrome  Chronic recurrent low back pain  Anxiety  Remote history of positive PPD  GE reflux  Lumbar disc disease  History of benign microscopic hematuria  History of adenomatous polyps with colonoscopy in 2008  Obesity  Plan: Return in 6 months and continue same medications. She should check regarding repeat colonoscopy. If not, we can give 3 Hemoccult cards for her to do at home.  Subjective:   Patient presents for Medicare Annual/Subsequent preventive examination.  Review Past Medical/Family/Social:See above   Risk Factors  Current exercise habits:Mostly sedentary due to back pain Dietary issues discussed: Low fat low carbohydrate  Cardiac risk factors:Diabetes, hyperlipidemia, family history in brother  Depression Screen  (Note: if answer to either of the following is "Yes", a more complete depression screening is indicated)   Over the past two weeks, have you felt down, depressed or hopeless? No  Over the past two weeks, have you felt little interest or pleasure in doing things? No Have you lost interest or pleasure in daily life? No Do you often feel hopeless? No Do you cry easily over simple problems? No   Activities of Daily Living  In your present state of health, do you have any difficulty performing the following activities?:   Driving? No  Managing money? No  Feeding yourself? No  Getting from bed to chair? No  Climbing a flight of stairs? No  Preparing food and eating?: No  Bathing or showering? No  Getting dressed: No  Getting to the toilet? No  Using the toilet:No  Moving around from place to place: No  In the past year have you fallen or had a  near fall?:No  Are you sexually active? No  Do you have more than one partner? No   Hearing Difficulties: No  Do you often ask people to speak up or repeat themselves? Occasionally Do you experience ringing or noises in your ears? yes  Do you have difficulty understanding soft or whispered voices? A little bit Do you feel that you have a problem with memory? No Do you often misplace items? No    Home Safety:  Do you have a smoke alarm at your residence? Yes Do you have grab bars in the bathroom?No Do you have throw rugs in your house? Very few   Cognitive Testing  Alert? Yes Normal Appearance?Yes  Oriented to person? Yes Place? Yes  Time? Yes  Recall of three objects? Yes  Can perform simple calculations? Yes  Displays appropriate judgment?Yes  Can read the correct time from a watch face?Yes   List the Names of Other Physician/Practitioners you currently use:  See referral list for the physicians patient is currently seeing.     Review of Systems: See above   Objective:     General appearance: Appears stated age and mildly obese  Head: Normocephalic, without obvious abnormality, atraumatic  Eyes: conj clear, EOMi PEERLA  Ears: normal TM's and external ear canals both ears  Nose: Nares normal. Septum midline. Mucosa normal. No drainage or sinus tenderness.  Throat: lips, mucosa, and tongue normal; teeth and gums normal  Neck: no adenopathy, no carotid bruit, no JVD, supple, symmetrical, trachea midline and thyroid not enlarged, symmetric, no tenderness/mass/nodules  No CVA tenderness.  Lungs: clear to auscultation bilaterally  Breasts: normal appearance, no masses or tenderness  Heart: regular rate and rhythm, S1, S2 normal, no murmur, click, rub or gallop  Abdomen: soft, non-tender; bowel sounds normal; no masses, no organomegaly  Musculoskeletal: ROM normal in all joints, no crepitus, no deformity, Normal muscle strengthen. Back  is symmetric, no curvature. Skin:  Skin color, texture, turgor normal. No rashes or lesions  Lymph nodes: Cervical, supraclavicular, and axillary nodes normal.  Neurologic: CN 2 -12 Normal, Normal symmetric reflexes. Normal coordination and gait  Psych: Alert & Oriented x 3, Mood appear stable.    Assessment:    Annual wellness medicare exam   Plan:    During the course of the visit the patient was educated and counseled about appropriate screening and preventive services including:   Annual mammogram  If colonoscopy declined, needs to do 3 Hemoccult cards  Recommend annual flu vaccine     Patient Instructions (the written plan) was given to the patient.  Medicare Attestation  I have personally reviewed:  The patient's medical and social history  Their use of alcohol, tobacco or illicit drugs  Their current medications and supplements  The patient's functional ability including ADLs,fall risks, home safety risks, cognitive, and hearing and visual impairment  Diet and physical activities  Evidence for depression or mood disorders  The patient's weight, height, BMI, and visual acuity have been recorded in the chart. I have made referrals, counseling, and provided education to the patient based on review of the above and I have provided the patient with a written personalized care plan for preventive services.

## 2016-07-27 LAB — URINALYSIS, MICROSCOPIC ONLY
Bacteria, UA: NONE SEEN [HPF]
Casts: NONE SEEN [LPF]
Crystals: NONE SEEN [HPF]
RBC / HPF: NONE SEEN RBC/HPF (ref <=2)
SQUAMOUS EPITHELIAL / LPF: NONE SEEN [HPF] (ref <=5)
WBC UA: NONE SEEN WBC/HPF (ref <=5)
Yeast: NONE SEEN [HPF]

## 2016-08-08 ENCOUNTER — Encounter: Payer: Self-pay | Admitting: Internal Medicine

## 2016-08-08 ENCOUNTER — Ambulatory Visit (INDEPENDENT_AMBULATORY_CARE_PROVIDER_SITE_OTHER): Payer: Medicare Other | Admitting: Internal Medicine

## 2016-08-08 DIAGNOSIS — Z1211 Encounter for screening for malignant neoplasm of colon: Secondary | ICD-10-CM

## 2016-08-08 LAB — HEMOCCULT GUIAC POC 1CARD (OFFICE)
Card #2 Fecal Occult Blod, POC: NEGATIVE
FECAL OCCULT BLD: NEGATIVE
Fecal Occult Blood, POC: NEGATIVE

## 2016-08-08 NOTE — Patient Instructions (Signed)
hemocult cards negative x 3

## 2016-08-08 NOTE — Progress Notes (Addendum)
Hemoccult cards received in mail today, all  3  negative.  Lenise Arena, CMA  Note reviewed. MJB

## 2016-09-08 DIAGNOSIS — R3121 Asymptomatic microscopic hematuria: Secondary | ICD-10-CM | POA: Diagnosis not present

## 2016-10-28 ENCOUNTER — Other Ambulatory Visit: Payer: Self-pay | Admitting: Internal Medicine

## 2016-10-28 NOTE — Telephone Encounter (Signed)
Refill for one year 

## 2016-12-14 ENCOUNTER — Other Ambulatory Visit: Payer: Self-pay

## 2016-12-14 MED ORDER — IBUPROFEN 800 MG PO TABS: 800.0000 mg | ORAL_TABLET | Freq: Three times a day (TID) | ORAL | 3 refills | Status: DC

## 2016-12-20 DIAGNOSIS — Z9841 Cataract extraction status, right eye: Secondary | ICD-10-CM | POA: Diagnosis not present

## 2016-12-20 DIAGNOSIS — E119 Type 2 diabetes mellitus without complications: Secondary | ICD-10-CM | POA: Diagnosis not present

## 2016-12-20 DIAGNOSIS — H52223 Regular astigmatism, bilateral: Secondary | ICD-10-CM | POA: Diagnosis not present

## 2016-12-20 DIAGNOSIS — Z9842 Cataract extraction status, left eye: Secondary | ICD-10-CM | POA: Diagnosis not present

## 2016-12-30 ENCOUNTER — Other Ambulatory Visit: Payer: Self-pay | Admitting: Internal Medicine

## 2016-12-30 NOTE — Telephone Encounter (Signed)
Done

## 2016-12-30 NOTE — Telephone Encounter (Signed)
REfill x 6 months

## 2017-01-12 ENCOUNTER — Other Ambulatory Visit: Payer: Self-pay | Admitting: Internal Medicine

## 2017-01-27 DIAGNOSIS — E119 Type 2 diabetes mellitus without complications: Secondary | ICD-10-CM | POA: Diagnosis not present

## 2017-01-27 DIAGNOSIS — E785 Hyperlipidemia, unspecified: Secondary | ICD-10-CM | POA: Diagnosis not present

## 2017-02-02 ENCOUNTER — Encounter: Payer: Self-pay | Admitting: Internal Medicine

## 2017-02-02 ENCOUNTER — Ambulatory Visit (INDEPENDENT_AMBULATORY_CARE_PROVIDER_SITE_OTHER): Payer: Medicare Other | Admitting: Internal Medicine

## 2017-02-02 VITALS — BP 160/80 | HR 67 | Ht 60.5 in | Wt 187.0 lb

## 2017-02-02 DIAGNOSIS — F419 Anxiety disorder, unspecified: Secondary | ICD-10-CM | POA: Diagnosis not present

## 2017-02-02 DIAGNOSIS — I10 Essential (primary) hypertension: Secondary | ICD-10-CM | POA: Diagnosis not present

## 2017-02-02 DIAGNOSIS — E8881 Metabolic syndrome: Secondary | ICD-10-CM | POA: Diagnosis not present

## 2017-02-02 DIAGNOSIS — M545 Low back pain: Secondary | ICD-10-CM

## 2017-02-02 DIAGNOSIS — E119 Type 2 diabetes mellitus without complications: Secondary | ICD-10-CM | POA: Diagnosis not present

## 2017-02-02 DIAGNOSIS — G8929 Other chronic pain: Secondary | ICD-10-CM

## 2017-02-02 DIAGNOSIS — E7849 Other hyperlipidemia: Secondary | ICD-10-CM | POA: Diagnosis not present

## 2017-02-02 MED ORDER — ALPRAZOLAM 0.5 MG PO TABS: 0.5000 mg | ORAL_TABLET | Freq: Two times a day (BID) | ORAL | 5 refills | Status: DC

## 2017-02-02 NOTE — Patient Instructions (Addendum)
Return in 3 weeks with blood pressure readings.  Work on diet exercise and weight loss.  Hemoglobin A1c has increased a bit.  Xanax refilled

## 2017-02-02 NOTE — Progress Notes (Signed)
   Subjective:    Patient ID: Leah Barron, female    DOB: 1940/10/26, 77 y.o.   MRN: [id number]  HPI 77 year old Female for 6 month recheck. Lipid panel and liver functions are normal.  She is on statin medication.  She is on ramipril and HCTZ for hypertension.  Her blood pressure is elevated.  She took her medication about an hour and a half ago.  She is to keep track of blood pressure 3 times a week and return in 3 weeks or so for office visit blood pressure check and review of her blood pressure readings at home.  She is asking that Xanax be refilled.  Is been having some trouble with her right upper extremity and back after walking her daughter's dogs.  She started going to the gym before the cold weather and that seemed to help with the musculoskeletal pain.  She has ibuprofen to take for pain.  She may take some Tylenol in addition to the ibuprofen for pain.  Long-standing history of back issues.   History of controlled type 2 diabetes mellitus treated with metformin.  A1c is 6.4%.  Needs to work on diet and exercise.    Review of Systems see above     Objective:   Physical Exam  Neck is supple without JVD thyromegaly or carotid bruits.  Chest clear.  Cardiac exam regular rate and rhythm normal S1 and S2.  Abdomen no hepatosplenomegaly masses or tenderness.  Extremities without edema.      Assessment & Plan:  Controlled type 2 diabetes mellitus.  Hemoglobin A1c 6.4% and in July was 6.1%.  Work on diet exercise and weight loss  Back pain-continue ibuprofen and may take Tylenol in addition to ibuprofen  Essential hypertension-has element of office hypertension.  Is to return in 3 weeks with home blood pressure readings and office visit to discuss management  Hyperlipidemia-continue lipid-lowering medication  Anxiety-Xanax refilled  Plan: Follow-up in 3 weeks with regard to blood pressure

## 2017-03-06 ENCOUNTER — Ambulatory Visit (INDEPENDENT_AMBULATORY_CARE_PROVIDER_SITE_OTHER): Payer: Medicare Other | Admitting: Internal Medicine

## 2017-03-06 ENCOUNTER — Encounter: Payer: Self-pay | Admitting: Internal Medicine

## 2017-03-06 VITALS — BP 150/72 | HR 64 | Ht 60.5 in | Wt 188.0 lb

## 2017-03-06 DIAGNOSIS — G2581 Restless legs syndrome: Secondary | ICD-10-CM | POA: Diagnosis not present

## 2017-03-06 DIAGNOSIS — E8881 Metabolic syndrome: Secondary | ICD-10-CM

## 2017-03-06 DIAGNOSIS — I10 Essential (primary) hypertension: Secondary | ICD-10-CM

## 2017-03-06 LAB — IRON,TIBC AND FERRITIN PANEL
%SAT: 18 % (calc) (ref 11–50)
FERRITIN: 21 ng/mL (ref 20–288)
Iron: 52 ug/dL (ref 45–160)
TIBC: 285 mcg/dL (calc) (ref 250–450)

## 2017-03-06 MED ORDER — OLMESARTAN MEDOXOMIL 20 MG PO TABS: 20.0000 mg | ORAL_TABLET | Freq: Every day | ORAL | 0 refills | Status: DC

## 2017-03-06 MED ORDER — ROPINIROLE HCL 0.5 MG PO TABS: 0.5000 mg | ORAL_TABLET | Freq: Every day | ORAL | 0 refills | Status: DC

## 2017-03-06 NOTE — Patient Instructions (Signed)
Change ramipril to Benicar 20 mg daily.  Call with blood pressure readings.  Schedule colonoscopy with Dr. Amedeo Plenty by calling his office.  Try Requip for restless leg.  Physical exam due July or early August.

## 2017-03-06 NOTE — Progress Notes (Signed)
   Subjective:    Patient ID: Leah Barron, female    DOB: Aug 31, 1940, 77 y.o.   MRN: [id number]  HPI 77 year old Female for BP check.  She is on ramipril and HCTZ.  She brings in multiple blood pressure readings.  Systolic blood pressures range from 130-165.  She got a new meter and blood pressures are still ranging systolically from 122-482.  Were going to stop ramipril and start her on Benicar 20 mg daily and continue HCTZ separately.  [facility]  She is also complaining of right restless leg.  She has some pain in her legs that bother her at night.  We drew iron and iron binding capacity today start her on Requip 0.5 mg at night.    Review of Systems see above  She is also due for repeat colonoscopy and needs to call Dr. Amedeo Plenty.  In 2014 she had a tubular adenoma removed.     Objective:   Physical Exam Not examined but spent 15 minutes speaking with her about these issues.       Assessment & Plan:  Colonoscopy due with history of tubular adenoma  Restless legs start Requip 0.5 mg at bedtime  Essential hypertension.  Discontinue ramipril, continue HCTZ and start Benicar 20 mg every morning.  She will call with blood pressure readings.  Physical exam is due late July or early August.  This needs to be booked.

## 2017-03-30 ENCOUNTER — Other Ambulatory Visit: Payer: Self-pay

## 2017-03-30 MED ORDER — OLMESARTAN MEDOXOMIL 20 MG PO TABS: 20.0000 mg | ORAL_TABLET | Freq: Every day | ORAL | 0 refills | Status: DC

## 2017-03-31 ENCOUNTER — Encounter: Payer: Self-pay | Admitting: Internal Medicine

## 2017-03-31 ENCOUNTER — Telehealth: Payer: Self-pay | Admitting: Internal Medicine

## 2017-03-31 ENCOUNTER — Other Ambulatory Visit: Payer: Self-pay

## 2017-03-31 MED ORDER — OLMESARTAN MEDOXOMIL 20 MG PO TABS: 20.0000 mg | ORAL_TABLET | Freq: Every day | ORAL | 1 refills | Status: DC

## 2017-03-31 NOTE — Telephone Encounter (Signed)
Received notification from pharmacy that Benicar was on back order.  Patient requesting new prescription.  Patient may go back  To Ramipril 10 mg daily until Benicar can be obtained.  Continue HCTZ 25 mg daily.  This was faxed to [place] on Friday evening at 5:30 PM

## 2017-03-31 NOTE — Telephone Encounter (Signed)
Called pt who apparently found Benicar at [facility]. Had left over Ramipril and had not started Benicar yet. She will start Benicar from [facility] and not pick up Ramipril from [facility]. I need to see her in 3 weeks to see how Benicar is working. She is to continue HCTZ.

## 2017-04-24 ENCOUNTER — Other Ambulatory Visit: Payer: Self-pay | Admitting: Internal Medicine

## 2017-05-02 DIAGNOSIS — H6063 Unspecified chronic otitis externa, bilateral: Secondary | ICD-10-CM | POA: Diagnosis not present

## 2017-05-02 DIAGNOSIS — H6122 Impacted cerumen, left ear: Secondary | ICD-10-CM | POA: Diagnosis not present

## 2017-05-09 ENCOUNTER — Telehealth: Payer: Self-pay | Admitting: Internal Medicine

## 2017-05-09 NOTE — Telephone Encounter (Signed)
Faxed Office notes, recent labs and previous colonoscopy to Dr Watt Climes office for upcoming colonoscopy.

## 2017-05-18 ENCOUNTER — Other Ambulatory Visit: Payer: Self-pay | Admitting: Internal Medicine

## 2017-05-19 ENCOUNTER — Encounter: Payer: Self-pay | Admitting: Internal Medicine

## 2017-05-19 DIAGNOSIS — Z1231 Encounter for screening mammogram for malignant neoplasm of breast: Secondary | ICD-10-CM | POA: Diagnosis not present

## 2017-05-19 DIAGNOSIS — M8589 Other specified disorders of bone density and structure, multiple sites: Secondary | ICD-10-CM | POA: Diagnosis not present

## 2017-05-31 ENCOUNTER — Encounter: Payer: Self-pay | Admitting: Internal Medicine

## 2017-05-31 DIAGNOSIS — D126 Benign neoplasm of colon, unspecified: Secondary | ICD-10-CM | POA: Diagnosis not present

## 2017-05-31 DIAGNOSIS — K5730 Diverticulosis of large intestine without perforation or abscess without bleeding: Secondary | ICD-10-CM | POA: Diagnosis not present

## 2017-05-31 DIAGNOSIS — Z86010 Personal history of colonic polyps: Secondary | ICD-10-CM | POA: Diagnosis not present

## 2017-06-02 DIAGNOSIS — D126 Benign neoplasm of colon, unspecified: Secondary | ICD-10-CM | POA: Diagnosis not present

## 2017-06-13 ENCOUNTER — Other Ambulatory Visit: Payer: Self-pay | Admitting: Internal Medicine

## 2017-06-26 ENCOUNTER — Other Ambulatory Visit: Payer: Self-pay | Admitting: Internal Medicine

## 2017-07-08 ENCOUNTER — Other Ambulatory Visit: Payer: Self-pay | Admitting: Internal Medicine

## 2017-07-19 ENCOUNTER — Other Ambulatory Visit: Payer: Self-pay | Admitting: Internal Medicine

## 2017-07-19 DIAGNOSIS — I10 Essential (primary) hypertension: Secondary | ICD-10-CM

## 2017-07-19 DIAGNOSIS — K219 Gastro-esophageal reflux disease without esophagitis: Secondary | ICD-10-CM

## 2017-07-19 DIAGNOSIS — Z0000 Encounter for general adult medical examination without abnormal findings: Secondary | ICD-10-CM

## 2017-07-19 DIAGNOSIS — E119 Type 2 diabetes mellitus without complications: Secondary | ICD-10-CM

## 2017-07-20 ENCOUNTER — Other Ambulatory Visit: Payer: Self-pay | Admitting: Internal Medicine

## 2017-07-27 ENCOUNTER — Other Ambulatory Visit: Payer: Medicare Other | Admitting: Internal Medicine

## 2017-07-27 DIAGNOSIS — K219 Gastro-esophageal reflux disease without esophagitis: Secondary | ICD-10-CM

## 2017-07-27 DIAGNOSIS — Z0000 Encounter for general adult medical examination without abnormal findings: Secondary | ICD-10-CM

## 2017-07-27 DIAGNOSIS — I10 Essential (primary) hypertension: Secondary | ICD-10-CM | POA: Diagnosis not present

## 2017-07-27 DIAGNOSIS — E119 Type 2 diabetes mellitus without complications: Secondary | ICD-10-CM | POA: Diagnosis not present

## 2017-07-28 LAB — COMPLETE METABOLIC PANEL WITH GFR
AG Ratio: 1.6 (calc) (ref 1.0–2.5)
ALBUMIN MSPROF: 4.2 g/dL (ref 3.6–5.1)
ALKALINE PHOSPHATASE (APISO): 74 U/L (ref 33–130)
ALT: 11 U/L (ref 6–29)
AST: 12 U/L (ref 10–35)
BILIRUBIN TOTAL: 0.5 mg/dL (ref 0.2–1.2)
BUN / CREAT RATIO: 19 (calc) (ref 6–22)
BUN: 18 mg/dL (ref 7–25)
CHLORIDE: 103 mmol/L (ref 98–110)
CO2: 32 mmol/L (ref 20–32)
Calcium: 9.8 mg/dL (ref 8.6–10.4)
Creat: 0.95 mg/dL — ABNORMAL HIGH (ref 0.60–0.93)
GFR, Est African American: 67 mL/min/{1.73_m2} (ref >=60)
GFR, Est Non African American: 58 mL/min/{1.73_m2} — ABNORMAL LOW (ref >=60)
Globulin: 2.7 g/dL (calc) (ref 1.9–3.7)
Glucose, Bld: 119 mg/dL — ABNORMAL HIGH (ref 65–99)
Potassium: 3.8 mmol/L (ref 3.5–5.3)
Sodium: 141 mmol/L (ref 135–146)
Total Protein: 6.9 g/dL (ref 6.1–8.1)

## 2017-07-28 LAB — CBC WITH DIFFERENTIAL/PLATELET
Basophils Absolute: 9 cells/uL (ref 0–200)
Basophils Relative: 0.2 %
EOS PCT: 0.7 %
Eosinophils Absolute: 32 cells/uL (ref 15–500)
HEMATOCRIT: 38 % (ref 35.0–45.0)
Hemoglobin: 12.7 g/dL (ref 11.7–15.5)
LYMPHS ABS: 1557 {cells}/uL (ref 850–3900)
MCH: 30.8 pg (ref 27.0–33.0)
MCHC: 33.4 g/dL (ref 32.0–36.0)
MCV: 92 fL (ref 80.0–100.0)
MPV: 9.5 fL (ref 7.5–12.5)
Monocytes Relative: 7.4 %
NEUTROS PCT: 57.1 %
Neutro Abs: 2570 cells/uL (ref 1500–7800)
PLATELETS: 263 10*3/uL (ref 140–400)
RBC: 4.13 10*6/uL (ref 3.80–5.10)
RDW: 12.9 % (ref 11.0–15.0)
Total Lymphocyte: 34.6 %
WBC mixed population: 333 cells/uL (ref 200–950)
WBC: 4.5 10*3/uL (ref 3.8–10.8)

## 2017-07-28 LAB — HEMOGLOBIN A1C
EAG (MMOL/L): 7.3 (calc)
HEMOGLOBIN A1C: 6.2 %{Hb} — AB (ref <5.7)
Mean Plasma Glucose: 131 (calc)

## 2017-07-28 LAB — LIPID PANEL
Cholesterol: 161 mg/dL (ref <200)
HDL: 49 mg/dL — ABNORMAL LOW (ref >50)
LDL CHOLESTEROL (CALC): 81 mg/dL
NON-HDL CHOLESTEROL (CALC): 112 mg/dL (ref <130)
TRIGLYCERIDES: 221 mg/dL — AB (ref <150)
Total CHOL/HDL Ratio: 3.3 (calc) (ref <5.0)

## 2017-07-28 LAB — TSH: TSH: 1.09 m[IU]/L (ref 0.40–4.50)

## 2017-07-28 LAB — MICROALBUMIN / CREATININE URINE RATIO
CREATININE, URINE: 101 mg/dL (ref 20–275)
Microalb Creat Ratio: 13 mcg/mg creat (ref <30)
Microalb, Ur: 1.3 mg/dL

## 2017-08-01 ENCOUNTER — Encounter: Payer: Self-pay | Admitting: Internal Medicine

## 2017-08-01 ENCOUNTER — Ambulatory Visit (INDEPENDENT_AMBULATORY_CARE_PROVIDER_SITE_OTHER): Payer: Medicare Other | Admitting: Internal Medicine

## 2017-08-01 VITALS — BP 110/80 | HR 71 | Ht 61.0 in | Wt 185.0 lb

## 2017-08-01 DIAGNOSIS — K219 Gastro-esophageal reflux disease without esophagitis: Secondary | ICD-10-CM

## 2017-08-01 DIAGNOSIS — G2581 Restless legs syndrome: Secondary | ICD-10-CM

## 2017-08-01 DIAGNOSIS — E7849 Other hyperlipidemia: Secondary | ICD-10-CM

## 2017-08-01 DIAGNOSIS — E119 Type 2 diabetes mellitus without complications: Secondary | ICD-10-CM | POA: Diagnosis not present

## 2017-08-01 DIAGNOSIS — Z0000 Encounter for general adult medical examination without abnormal findings: Secondary | ICD-10-CM

## 2017-08-01 DIAGNOSIS — H1031 Unspecified acute conjunctivitis, right eye: Secondary | ICD-10-CM

## 2017-08-01 DIAGNOSIS — F411 Generalized anxiety disorder: Secondary | ICD-10-CM

## 2017-08-01 DIAGNOSIS — I10 Essential (primary) hypertension: Secondary | ICD-10-CM | POA: Diagnosis not present

## 2017-08-01 DIAGNOSIS — R8290 Unspecified abnormal findings in urine: Secondary | ICD-10-CM

## 2017-08-01 DIAGNOSIS — E8881 Metabolic syndrome: Secondary | ICD-10-CM

## 2017-08-01 DIAGNOSIS — Z6834 Body mass index (BMI) 34.0-34.9, adult: Secondary | ICD-10-CM

## 2017-08-01 DIAGNOSIS — M5432 Sciatica, left side: Secondary | ICD-10-CM

## 2017-08-01 LAB — POCT URINALYSIS DIPSTICK
Bilirubin, UA: NEGATIVE
GLUCOSE UA: NEGATIVE
KETONES UA: NEGATIVE
NITRITE UA: NEGATIVE
PROTEIN UA: NEGATIVE
SPEC GRAV UA: 1.01 (ref 1.010–1.025)
Urobilinogen, UA: 0.2 E.U./dL
pH, UA: 7.5 (ref 5.0–8.0)

## 2017-08-01 MED ORDER — OFLOXACIN 0.3 % OP SOLN: OPHTHALMIC | 0 refills | Status: DC

## 2017-08-01 MED ORDER — ALPRAZOLAM 0.5 MG PO TABS: 0.5000 mg | ORAL_TABLET | Freq: Two times a day (BID) | ORAL | 5 refills | Status: DC

## 2017-08-01 MED ORDER — CYCLOBENZAPRINE HCL 10 MG PO TABS: 10.0000 mg | ORAL_TABLET | Freq: Three times a day (TID) | ORAL | 0 refills | Status: DC | PRN

## 2017-08-01 NOTE — Patient Instructions (Addendum)
Flexeril ordered for back pain.  Please work on diet exercise and weight loss.  Ocuflox prescribed for right eye conjunctivitis.  Continue medications as previously ordered.

## 2017-08-01 NOTE — Progress Notes (Signed)
Subjective:    Patient ID: Leah Barron, female    DOB: 1940-07-23, 77 y.o.   MRN: [id number]  HPI 77 year old Black Female for Medicare wellness exam, health maintenance exam and evaluation of medical issues.   Review of labs shows AIC 6.2 % and previously 6%.  Triglycerides are elevated  at 221 and previously  were normal.She will try to watch diet a bit more.Her back has been hurting. See below. Has trace LE on urine dipstick and culture was sent.  Complaining of irritation right eye without discharge.  History of microscopic hematuria previously evaluated by urologist.  History of GE reflux, history of adenomatous colon polyps, history of anxiety.  History of lumbar disc herniation L5-S1.  History of controlled type 2 diabetes mellitus and essential hypertension.  History of obesity and metabolic syndrome.  Cholecystectomy 1998, hysterectomy without oophorectomy 1981, left breast biopsy 1978, bladder biopsies for microscopic hematuria 1980 in 1989 both of which were benign.  In 1996 she suffered a left ankle injury secondary to fall.  History of recurrent low back pain related to lumbar disc disease.  Colonoscopy in 2008.  She is intolerant of codeine and Parafon forte.  She is able to take hydrocodone APAP.  Social history: She is a widow.  Husband died from complications of cirrhosis of the liver.  Recently moved in with daughter near [address].  Used to live near [place].  One daughter with history of HIV doing well.  One daughter with history of mental illness.  Patient does not smoke or consume alcohol.  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  Family history: Total of 6 brothers.  One brother with history of hypertension and kidney failure.  One brother with history of diabetes mellitus, coronary artery disease and mental illness.  One brother died with lung cancer.  One sister died with diabetes mellitus, heart and kidney failure.  One sister died of cardiac  arrest.        Review of Systems right eye irritation. Continues with restless leg symptoms but did not try Requip.  Left back pain after walking a frisky dog. Hx back pain. Does not want Prednisone.Flexeril ordered.  Since GYN is checking her for an ovarian cyst and following her carefully     Objective:   Physical Exam  Constitutional: She is oriented to person, place, and time. She appears well-developed and well-nourished. No distress.  HENT:  Head: Normocephalic and atraumatic.  Right Ear: External ear normal.  Left Ear: External ear normal.  Mouth/Throat: Oropharynx is clear and moist. No oropharyngeal exudate.  Eyes: Pupils are equal, round, and reactive to light. Right eye exhibits no discharge. Left eye exhibits no discharge. No scleral icterus.  Neck: Neck supple. No JVD present. No tracheal deviation present. No thyromegaly present.  Cardiovascular: Normal rate, regular rhythm, normal heart sounds and intact distal pulses. Exam reveals no friction rub.  No murmur heard. Pulmonary/Chest: Effort normal and breath sounds normal. No stridor. No respiratory distress. She has no wheezes. She has no rales.  Abdominal: Soft. Bowel sounds are normal. She exhibits no distension and no mass. There is no tenderness. There is no rebound and no guarding.  Genitourinary:  Genitourinary Comments: Deferred to GYN  Musculoskeletal: She exhibits no edema.  Lymphadenopathy:    She has no cervical adenopathy.  Neurological: She is alert and oriented to person, place, and time. She displays normal reflexes. No cranial nerve deficit. She exhibits normal muscle tone. Coordination  normal.  Skin: Skin is warm and dry. She is not diaphoretic. No erythema. No pallor.  Psychiatric: She has a normal mood and affect. Her behavior is normal. Judgment and thought content normal.          Assessment & Plan:  Left buttock pain consistent with sciatica.  Have prescribed Flexeril.  She does not want  prednisone.  History of lumbar disc disease.   ovarian cyst being followed by GYN  Impaired glucose tolerance- watch diet and try to get some exercise.  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  Subjective:   Patient presents for Medicare Annual/Subsequent preventive examination.  Review Past Medical/Family/Social: See above   Risk Factors  Current exercise habits: Walks dogs Dietary issues discussed: Low-fat low carbohydrate  Cardiac risk factors: Hyperlipidemia, diabetes mellitus, family history of brother  Depression Screen  (Note: if answer to either of the following is "Yes", a more complete depression screening is indicated)   Over the past two weeks, have you felt down, depressed or hopeless? No  Over the past two weeks, have you felt little interest or pleasure in doing things? No Have you lost interest or pleasure in daily life? No Do you often feel hopeless? No Do you cry easily over simple problems? No   Activities of Daily Living  In your present state of health, do you have any difficulty performing the following activities?:   Driving? No  Managing money? No  Feeding yourself? No  Getting from bed to chair? No  Climbing a flight of stairs? No  Preparing food and eating?: No  Bathing or showering? No  Getting dressed: No  Getting to the toilet? No  Using  the toilet:No  Moving around from place to place: No  In the past year have you fallen or had a near fall?:yes- near fall no injury Are you sexually active? No  Do you have more than one partner? No   Hearing Difficulties: No  Do you often ask people to speak up or repeat themselves? yes Do you experience ringing or noises in your ears? yes Do you have difficulty understanding soft or whispered voices? No  Do you feel that you have a problem with memory? No Do you often misplace items? No    Home Safety:  Do you have a smoke alarm at your residence? Yes Do you have grab bars in the bathroom?  No Do you have throw rugs in your house?  Yes by   Cognitive Testing  Alert? Yes Normal Appearance?Yes  Oriented to person? Yes Place? Yes  Time? Yes  Recall of three objects? Yes  Can perform simple calculations? Yes  Displays appropriate judgment?Yes  Can read the correct time from a watch face?Yes   List the Names of Other Physician/Practitioners you currently use:  See referral list for the physicians patient is currently seeing.  GYN physician  Review of Systems: See above   Objective:     General appearance: Appears stated age and mildly obese  Head: Normocephalic, without obvious abnormality, atraumatic  Eyes: conj clear, EOMi PEERLA  Ears: normal TM's and external ear canals both ears  Nose: Nares normal. Septum midline. Mucosa normal. No drainage or sinus tenderness.  Throat: lips, mucosa, and tongue normal; teeth and gums normal  Neck: no adenopathy, no carotid bruit, no JVD, supple, symmetrical, trachea midline and thyroid not enlarged, symmetric, no tenderness/mass/nodules  No CVA tenderness.  Lungs: clear to auscultation bilaterally  Breasts: normal appearance, no masses or tenderness Heart: regular rate and rhythm, S1, S2 normal, no murmur, click, rub or gallop  Abdomen: soft, non-tender; bowel sounds normal; no masses, no organomegaly  Musculoskeletal: ROM  normal in all joints, no crepitus, no deformity, Normal muscle strengthen. Back  is symmetric, no curvature. Skin: Skin color, texture, turgor normal. No rashes or lesions  Lymph nodes: Cervical, supraclavicular, and axillary nodes normal.  Neurologic: CN 2 -12 Normal, Normal symmetric reflexes. Normal coordination and gait  Psych: Alert & Oriented x 3, Mood appear stable.    Assessment:    Annual wellness medicare exam   Plan:    During the course of the visit the patient was educated and counseled about appropriate screening and preventive services including:   Annual mammogram  Annual flu vaccine     Patient Instructions (the written plan) was given to the patient.  Medicare Attestation  I have personally reviewed:  The patient's medical and social history  Their use of alcohol, tobacco or illicit drugs  Their current medications and supplements  The patient's functional ability including ADLs,fall risks, home safety risks, cognitive, and hearing and visual impairment  Diet and physical activities  Evidence for depression or mood disorders  The patient's weight, height, BMI, and visual acuity have been recorded in the chart. I have made referrals, counseling, and provided education to the patient based on review of the above and I have provided the patient with a written personalized care plan for preventive services.

## 2017-08-02 LAB — URINE CULTURE
MICRO NUMBER:: 90899266
Result:: NO GROWTH
SPECIMEN QUALITY: ADEQUATE

## 2017-11-02 ENCOUNTER — Other Ambulatory Visit: Payer: Self-pay | Admitting: Internal Medicine

## 2017-12-15 ENCOUNTER — Other Ambulatory Visit: Payer: Self-pay | Admitting: Internal Medicine

## 2017-12-21 ENCOUNTER — Other Ambulatory Visit: Payer: Self-pay

## 2017-12-21 MED ORDER — ALPRAZOLAM 0.5 MG PO TABS: 0.5000 mg | ORAL_TABLET | Freq: Two times a day (BID) | ORAL | 0 refills | Status: DC

## 2017-12-21 NOTE — Telephone Encounter (Signed)
Patient called back she said she found ALPRAZOLAM .5mg  at [facility] on [address]. Okay to refill?

## 2017-12-21 NOTE — Telephone Encounter (Signed)
Refill once to new pharmacy

## 2017-12-25 DIAGNOSIS — H52223 Regular astigmatism, bilateral: Secondary | ICD-10-CM | POA: Diagnosis not present

## 2017-12-25 DIAGNOSIS — E119 Type 2 diabetes mellitus without complications: Secondary | ICD-10-CM | POA: Diagnosis not present

## 2017-12-25 DIAGNOSIS — H40013 Open angle with borderline findings, low risk, bilateral: Secondary | ICD-10-CM | POA: Diagnosis not present

## 2017-12-25 DIAGNOSIS — Z9842 Cataract extraction status, left eye: Secondary | ICD-10-CM | POA: Diagnosis not present

## 2017-12-25 DIAGNOSIS — Z9841 Cataract extraction status, right eye: Secondary | ICD-10-CM | POA: Diagnosis not present

## 2017-12-25 LAB — HM DIABETES EYE EXAM

## 2018-01-17 ENCOUNTER — Other Ambulatory Visit: Payer: Self-pay | Admitting: Internal Medicine

## 2018-01-26 ENCOUNTER — Other Ambulatory Visit: Payer: Self-pay

## 2018-01-26 MED ORDER — ROPINIROLE HCL 0.5 MG PO TABS: 0.5000 mg | ORAL_TABLET | Freq: Every day | ORAL | 3 refills | Status: DC

## 2018-01-31 ENCOUNTER — Other Ambulatory Visit: Payer: Self-pay | Admitting: Internal Medicine

## 2018-02-01 ENCOUNTER — Other Ambulatory Visit: Payer: Medicare Other | Admitting: Internal Medicine

## 2018-02-01 DIAGNOSIS — E119 Type 2 diabetes mellitus without complications: Secondary | ICD-10-CM | POA: Diagnosis not present

## 2018-02-01 DIAGNOSIS — E7849 Other hyperlipidemia: Secondary | ICD-10-CM

## 2018-02-02 LAB — HEPATIC FUNCTION PANEL
AG Ratio: 1.4 (calc) (ref 1.0–2.5)
ALT: 11 U/L (ref 6–29)
AST: 12 U/L (ref 10–35)
Albumin: 4 g/dL (ref 3.6–5.1)
Alkaline phosphatase (APISO): 72 U/L (ref 33–130)
Bilirubin, Direct: 0.1 mg/dL (ref 0.0–0.2)
Globulin: 2.9 g/dL (calc) (ref 1.9–3.7)
Indirect Bilirubin: 0.4 mg/dL (calc) (ref 0.2–1.2)
TOTAL PROTEIN: 6.9 g/dL (ref 6.1–8.1)
Total Bilirubin: 0.5 mg/dL (ref 0.2–1.2)

## 2018-02-02 LAB — HEMOGLOBIN A1C
Hgb A1c MFr Bld: 6.1 % of total Hgb — ABNORMAL HIGH (ref <5.7)
Mean Plasma Glucose: 128 (calc)
eAG (mmol/L): 7.1 (calc)

## 2018-02-02 LAB — MICROALBUMIN / CREATININE URINE RATIO
Creatinine, Urine: 59 mg/dL (ref 20–275)
Microalb Creat Ratio: 24 mcg/mg creat (ref <30)
Microalb, Ur: 1.4 mg/dL

## 2018-02-02 LAB — LIPID PANEL
Cholesterol: 151 mg/dL (ref <200)
HDL: 51 mg/dL (ref >50)
LDL Cholesterol (Calc): 76 mg/dL (calc)
NON-HDL CHOLESTEROL (CALC): 100 mg/dL (ref <130)
Total CHOL/HDL Ratio: 3 (calc) (ref <5.0)
Triglycerides: 139 mg/dL (ref <150)

## 2018-02-06 ENCOUNTER — Ambulatory Visit (INDEPENDENT_AMBULATORY_CARE_PROVIDER_SITE_OTHER): Payer: Medicare Other | Admitting: Internal Medicine

## 2018-02-06 ENCOUNTER — Encounter: Payer: Self-pay | Admitting: Internal Medicine

## 2018-02-06 VITALS — BP 140/80 | HR 70 | Temp 98.6°F | Ht 61.0 in | Wt 185.0 lb

## 2018-02-06 DIAGNOSIS — M545 Low back pain: Secondary | ICD-10-CM

## 2018-02-06 DIAGNOSIS — E119 Type 2 diabetes mellitus without complications: Secondary | ICD-10-CM

## 2018-02-06 DIAGNOSIS — I10 Essential (primary) hypertension: Secondary | ICD-10-CM

## 2018-02-06 DIAGNOSIS — G8929 Other chronic pain: Secondary | ICD-10-CM

## 2018-02-06 DIAGNOSIS — F411 Generalized anxiety disorder: Secondary | ICD-10-CM

## 2018-02-06 DIAGNOSIS — K219 Gastro-esophageal reflux disease without esophagitis: Secondary | ICD-10-CM | POA: Diagnosis not present

## 2018-02-06 DIAGNOSIS — G2581 Restless legs syndrome: Secondary | ICD-10-CM | POA: Diagnosis not present

## 2018-02-06 DIAGNOSIS — E8881 Metabolic syndrome: Secondary | ICD-10-CM | POA: Diagnosis not present

## 2018-02-06 DIAGNOSIS — E7849 Other hyperlipidemia: Secondary | ICD-10-CM

## 2018-02-06 MED ORDER — ALPRAZOLAM 0.5 MG PO TABS: 0.5000 mg | ORAL_TABLET | Freq: Two times a day (BID) | ORAL | 0 refills | Status: DC

## 2018-02-06 NOTE — Progress Notes (Signed)
   Subjective:    Patient ID: Leah Barron, female    DOB: March 02, 1940, 78 y.o.   MRN: [id number]  HPI In today for 37-month recheck.  Hemoglobin A1c stable at 6.1%.  Feels well with no new complaints.  Needs Xanax refilled.  Order given for shingles vaccine.  History of GE reflux, adenomatous colon polyps, anxiety, lumbar disc herniation L5-S1 with intermittent back pain.  Controlled type 2 diabetes mellitus is stable.  Has hypertension.  History of obesity and metabolic syndrome.  Had flu vaccine in September 2019.  Pneumococcal immunizations are up-to-date.  Had Zostavax vaccine in 2011.  Tetanus immunization up-to-date as was given in 2012.  Lipid panel and liver functions are normal.    Review of Systems feels well without any complaints     Objective:   Physical Exam vital signs reviewed Blood pressure 140/80 and repeated and was still 140/80.  Patient gets anxious at physician's office.  Skin warm and dry.  Nodes none.  Neck is supple without JVD thyromegaly or carotid bruits.  Chest is clear to auscultation.  Cardiac exam regular rate and rhythm normal S1 and S2 without murmurs or gallops.  Extremities without edema. See diabetic foot exam      Assessment & Plan:  Essential hypertension-patient says blood pressure is fine at home.  Continue olmesartan and HCTZ.  Controlled type 2 diabetes mellitus-hemoglobin A1c 6.1%  Hyperlipidemia-lipid panel normal on statin medication.  Continue statin medication  Office hypertension-continue to monitor blood pressure at home and call if persistently elevated  Anxiety state treated with Xanax  GE reflux treated with PPI  History of low back pain-has Flexeril on hand if needed  Complaining of restless leg symptoms.  We will try Requip 0.5 mg at bedtime  Plan: Continue current medications.  Shingrix vaccine written.  Physical exam appointment made for August 2020.

## 2018-02-14 ENCOUNTER — Other Ambulatory Visit: Payer: Self-pay | Admitting: Internal Medicine

## 2018-02-24 NOTE — Patient Instructions (Signed)
It was a pleasure to see you today.  Continue to monitor blood pressure at home and call if persistently elevated.  Watch diet.  Return in 6 months for physical exam.  Continue same medications.

## 2018-03-20 ENCOUNTER — Telehealth: Payer: Self-pay | Admitting: Internal Medicine

## 2018-03-20 MED ORDER — ALPRAZOLAM 0.5 MG PO TABS: 0.5000 mg | ORAL_TABLET | Freq: Two times a day (BID) | ORAL | 5 refills | Status: DC

## 2018-03-20 NOTE — Telephone Encounter (Signed)
Refill Xanax x 6 months 

## 2018-04-08 ENCOUNTER — Other Ambulatory Visit: Payer: Self-pay | Admitting: Internal Medicine

## 2018-05-05 ENCOUNTER — Other Ambulatory Visit: Payer: Self-pay | Admitting: Internal Medicine

## 2018-05-17 ENCOUNTER — Other Ambulatory Visit: Payer: Self-pay | Admitting: Internal Medicine

## 2018-07-04 ENCOUNTER — Encounter: Payer: Self-pay | Admitting: Internal Medicine

## 2018-07-04 DIAGNOSIS — Z1231 Encounter for screening mammogram for malignant neoplasm of breast: Secondary | ICD-10-CM | POA: Diagnosis not present

## 2018-07-10 DIAGNOSIS — H6981 Other specified disorders of Eustachian tube, right ear: Secondary | ICD-10-CM | POA: Diagnosis not present

## 2018-07-10 DIAGNOSIS — H6122 Impacted cerumen, left ear: Secondary | ICD-10-CM | POA: Diagnosis not present

## 2018-07-10 DIAGNOSIS — H6991 Unspecified Eustachian tube disorder, right ear: Secondary | ICD-10-CM | POA: Insufficient documentation

## 2018-07-10 DIAGNOSIS — H6123 Impacted cerumen, bilateral: Secondary | ICD-10-CM | POA: Insufficient documentation

## 2018-07-10 DIAGNOSIS — H938X1 Other specified disorders of right ear: Secondary | ICD-10-CM | POA: Diagnosis not present

## 2018-07-18 ENCOUNTER — Other Ambulatory Visit: Payer: Self-pay

## 2018-07-18 ENCOUNTER — Encounter: Payer: Self-pay | Admitting: Orthopedic Surgery

## 2018-07-18 ENCOUNTER — Ambulatory Visit: Payer: Medicare Other

## 2018-07-18 ENCOUNTER — Ambulatory Visit (INDEPENDENT_AMBULATORY_CARE_PROVIDER_SITE_OTHER): Payer: Medicare Other | Admitting: Orthopedic Surgery

## 2018-07-18 DIAGNOSIS — M25562 Pain in left knee: Secondary | ICD-10-CM

## 2018-07-18 DIAGNOSIS — M25461 Effusion, right knee: Secondary | ICD-10-CM

## 2018-07-18 DIAGNOSIS — M25561 Pain in right knee: Secondary | ICD-10-CM | POA: Diagnosis not present

## 2018-07-18 NOTE — Progress Notes (Signed)
Office Visit Note   Patient: Leah Barron           Date of Birth: April 23, 1940           MRN: [id number] Visit Date: 07/18/2018 Requested by: Elby Showers, MD [address] [place],  [place] [id number] PCP: Elby Showers, MD  Subjective: Chief Complaint  Patient presents with  . Right Knee - Pain  . Left Knee - Pain    HPI: Pt is a 78 y.o. Female who presents to the clinic complaining of R knee pain.  She had bilateral knee pain that began in March and subsequently resolved, but then in May she re-aggravated her knees while sleeping on an air mattress.  Her L knee pain has resolved but her R knee pain continues. She reports medial R knee pain without weakness or instability.  She notes swelling in the back of her knee.  Denies any mechanical symptoms.  Pain is worst with pushing off and getting up out of a chair but it improves as she walks.  She has been using a brace and occasional Ibuprofen with some relief.  Denies groin pain or LBP.  No history of knee surgery or injections.              ROS: All systems reviewed are negative as they relate to the chief complaint within the history of present illness.  Patient denies  fevers or chills.   Assessment & Plan: Visit Diagnoses:  1. Pain in both knees, unspecified chronicity     Plan: Pt has been experiencing R knee pain for several weeks. Her L knee pain resolved on its own.  Pt is likely dealing with some mild-to-moderate medial compartment arthritis or a degenerative mensical tear.  Discussed options available to patient and offered to draw fluid off her knee and give her a cortisone injection.  Pt declined for now.  Pt will continue conservative management of Ibuprofen occasionally with a knee brace.  She will f/u with the office prn if she decides she wants an injection.    Follow-Up Instructions: No follow-ups on file.   Orders:  Orders Placed This Encounter  Procedures  . XR Knee 1-2 Views Right  . XR Knee 1-2  Views Left   No orders of the defined types were placed in this encounter.     Procedures: No procedures performed   Clinical Data: No additional findings.  Objective: Vital Signs: There were no vitals taken for this visit.  Physical Exam:   Constitutional: Patient appears well-developed HEENT:  Head: Normocephalic Eyes:EOM are normal Neck: Normal range of motion Cardiovascular: Normal rate Pulmonary/chest: Effort normal Neurologic: Patient is alert Skin: Skin is warm Psychiatric: Patient has normal mood and affect    Ortho Exam:  R knee exam Mild effusion Extensor mechanism intact No TTP over the lateral jointlines, quad tendon, patellar tendon, pes anserinus, patella, tibial tubercle TTP over the medial jointline Stable to varus/valgus stresses.  Stable to anterior/posterior drawer Extension to 0 degrees Flexion > 90 degrees  L knee exam No effusion Extensor mechanism intact No TTP over the medial or lateral jointlines, quad tendon, patellar tendon, pes anserinus, patella, tibial tubercle, LCL/MCL insertions Stable to varus/valgus stresses.  Stable to anterior/posterior drawer Extension to 0 degrees Flexion > 90 degrees   Specialty Comments:  No specialty comments available.  Imaging: No results found.   PMFS History: Patient Active Problem List   Diagnosis Date Noted  . Essential hypertension 08/02/2015  .  Anxiety 09/05/2011  . Intertrigo 09/05/2011  . 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 GERD (gastroesophageal reflux disease) 05/11/2010  . Type 2 diabetes mellitus ([place]) 05/11/2010  . Hyperlipidemia 05/11/2010  . Vitamin D deficiency 05/11/2010  . Hx of adenomatous colonic polyps 05/11/2010  . Lumbar disc herniation 05/11/2010  . Mild allergic rhinitis 05/11/2010  . [place] intertrigo 05/11/2010  . Hematuria, microscopic 05/11/2010  . History of recurrent UTIs 05/11/2010   Past Medical History:  Diagnosis Date  . Allergy   . Anxiety    . Cataract   . Diabetes mellitus   . GERD (gastroesophageal reflux disease)   . Hematuria, microscopic   . History of recurrent UTIs 05/11/2010  . Hyperlipidemia   . Positive TB test   . Vitamin D deficiency     Family History  Problem Relation Age of Onset  . Dementia Mother   . Diabetes Father   . Diabetes Sister   . Heart disease Sister   . Diabetes Brother   . Kidney disease Brother   . HIV Daughter     Past Surgical History:  Procedure Laterality Date  . ABDOMINAL HYSTERECTOMY    . BREAST SURGERY  1978   l breast biopsy  . CHOLECYSTECTOMY    . CYSTOSTOMY W/ BLADDER BIOPSY  1984, 1989   Social History   Occupational History  . Not on file  Tobacco Use  . Smoking status: Never Smoker  . Smokeless tobacco: Never Used  Substance and Sexual Activity  . Alcohol use: No  . Drug use: No  . Sexual activity: Never    Comment: [employer] since age 25

## 2018-07-18 NOTE — Progress Notes (Signed)
Call patient. She should take cortisone injection for her knee pain

## 2018-08-07 ENCOUNTER — Other Ambulatory Visit: Payer: Self-pay

## 2018-08-07 ENCOUNTER — Other Ambulatory Visit: Payer: Medicare Other | Admitting: Internal Medicine

## 2018-08-07 DIAGNOSIS — I10 Essential (primary) hypertension: Secondary | ICD-10-CM | POA: Diagnosis not present

## 2018-08-07 DIAGNOSIS — E119 Type 2 diabetes mellitus without complications: Secondary | ICD-10-CM

## 2018-08-07 DIAGNOSIS — Z0000 Encounter for general adult medical examination without abnormal findings: Secondary | ICD-10-CM

## 2018-08-07 DIAGNOSIS — G2581 Restless legs syndrome: Secondary | ICD-10-CM | POA: Diagnosis not present

## 2018-08-07 DIAGNOSIS — E7849 Other hyperlipidemia: Secondary | ICD-10-CM | POA: Diagnosis not present

## 2018-08-07 DIAGNOSIS — F411 Generalized anxiety disorder: Secondary | ICD-10-CM

## 2018-08-07 DIAGNOSIS — K219 Gastro-esophageal reflux disease without esophagitis: Secondary | ICD-10-CM

## 2018-08-08 LAB — LIPID PANEL
Cholesterol: 140 mg/dL (ref <200)
HDL: 44 mg/dL — ABNORMAL LOW (ref >=50)
LDL Cholesterol (Calc): 71 mg/dL (calc)
Non-HDL Cholesterol (Calc): 96 mg/dL (calc) (ref <130)
Total CHOL/HDL Ratio: 3.2 (calc) (ref <5.0)
Triglycerides: 176 mg/dL — ABNORMAL HIGH (ref <150)

## 2018-08-08 LAB — CBC WITH DIFFERENTIAL/PLATELET
Absolute Monocytes: 354 cells/uL (ref 200–950)
Basophils Absolute: 9 cells/uL (ref 0–200)
Basophils Relative: 0.2 %
Eosinophils Absolute: 32 cells/uL (ref 15–500)
Eosinophils Relative: 0.7 %
HCT: 36.4 % (ref 35.0–45.0)
Hemoglobin: 12 g/dL (ref 11.7–15.5)
Lymphs Abs: 1670 cells/uL (ref 850–3900)
MCH: 30.4 pg (ref 27.0–33.0)
MCHC: 33 g/dL (ref 32.0–36.0)
MCV: 92.2 fL (ref 80.0–100.0)
MPV: 9.8 fL (ref 7.5–12.5)
Monocytes Relative: 7.7 %
Neutro Abs: 2535 cells/uL (ref 1500–7800)
Neutrophils Relative %: 55.1 %
Platelets: 275 10*3/uL (ref 140–400)
RBC: 3.95 10*6/uL (ref 3.80–5.10)
RDW: 13 % (ref 11.0–15.0)
Total Lymphocyte: 36.3 %
WBC: 4.6 10*3/uL (ref 3.8–10.8)

## 2018-08-08 LAB — COMPLETE METABOLIC PANEL WITH GFR
AG Ratio: 1.4 (calc) (ref 1.0–2.5)
ALT: 11 U/L (ref 6–29)
AST: 13 U/L (ref 10–35)
Albumin: 4.1 g/dL (ref 3.6–5.1)
Alkaline phosphatase (APISO): 67 U/L (ref 37–153)
BUN/Creatinine Ratio: 20 (calc) (ref 6–22)
BUN: 19 mg/dL (ref 7–25)
CO2: 31 mmol/L (ref 20–32)
Calcium: 9.9 mg/dL (ref 8.6–10.4)
Chloride: 104 mmol/L (ref 98–110)
Creat: 0.95 mg/dL — ABNORMAL HIGH (ref 0.60–0.93)
GFR, Est African American: 66 mL/min/{1.73_m2} (ref >=60)
GFR, Est Non African American: 57 mL/min/{1.73_m2} — ABNORMAL LOW (ref >=60)
Globulin: 2.9 g/dL (calc) (ref 1.9–3.7)
Glucose, Bld: 113 mg/dL — ABNORMAL HIGH (ref 65–99)
Potassium: 3.9 mmol/L (ref 3.5–5.3)
Sodium: 142 mmol/L (ref 135–146)
Total Bilirubin: 0.5 mg/dL (ref 0.2–1.2)
Total Protein: 7 g/dL (ref 6.1–8.1)

## 2018-08-08 LAB — MICROALBUMIN / CREATININE URINE RATIO
Creatinine, Urine: 68 mg/dL (ref 20–275)
Microalb Creat Ratio: 16 mcg/mg creat (ref <30)
Microalb, Ur: 1.1 mg/dL

## 2018-08-08 LAB — HEMOGLOBIN A1C
Hgb A1c MFr Bld: 6.3 % of total Hgb — ABNORMAL HIGH (ref <5.7)
Mean Plasma Glucose: 134 (calc)
eAG (mmol/L): 7.4 (calc)

## 2018-08-08 LAB — TSH: TSH: 1.17 mIU/L (ref 0.40–4.50)

## 2018-08-09 ENCOUNTER — Encounter: Payer: Self-pay | Admitting: Internal Medicine

## 2018-08-09 ENCOUNTER — Ambulatory Visit (INDEPENDENT_AMBULATORY_CARE_PROVIDER_SITE_OTHER): Payer: Medicare Other | Admitting: Internal Medicine

## 2018-08-09 ENCOUNTER — Other Ambulatory Visit: Payer: Self-pay

## 2018-08-09 VITALS — BP 130/80 | HR 75 | Temp 98.2°F | Ht 61.0 in | Wt 186.0 lb

## 2018-08-09 DIAGNOSIS — Z0000 Encounter for general adult medical examination without abnormal findings: Secondary | ICD-10-CM | POA: Diagnosis not present

## 2018-08-09 DIAGNOSIS — E119 Type 2 diabetes mellitus without complications: Secondary | ICD-10-CM | POA: Diagnosis not present

## 2018-08-09 DIAGNOSIS — I10 Essential (primary) hypertension: Secondary | ICD-10-CM

## 2018-08-09 DIAGNOSIS — G2581 Restless legs syndrome: Secondary | ICD-10-CM | POA: Diagnosis not present

## 2018-08-09 DIAGNOSIS — F411 Generalized anxiety disorder: Secondary | ICD-10-CM

## 2018-08-09 DIAGNOSIS — K219 Gastro-esophageal reflux disease without esophagitis: Secondary | ICD-10-CM | POA: Diagnosis not present

## 2018-08-09 DIAGNOSIS — R195 Other fecal abnormalities: Secondary | ICD-10-CM

## 2018-08-09 DIAGNOSIS — R3129 Other microscopic hematuria: Secondary | ICD-10-CM

## 2018-08-09 DIAGNOSIS — E8881 Metabolic syndrome: Secondary | ICD-10-CM

## 2018-08-09 DIAGNOSIS — G8929 Other chronic pain: Secondary | ICD-10-CM

## 2018-08-09 DIAGNOSIS — E7849 Other hyperlipidemia: Secondary | ICD-10-CM

## 2018-08-09 DIAGNOSIS — M545 Low back pain: Secondary | ICD-10-CM

## 2018-08-09 DIAGNOSIS — Z6834 Body mass index (BMI) 34.0-34.9, adult: Secondary | ICD-10-CM

## 2018-08-09 LAB — POCT URINALYSIS DIPSTICK
Appearance: NEGATIVE
Bilirubin, UA: NEGATIVE
Glucose, UA: NEGATIVE
Ketones, UA: NEGATIVE
Leukocytes, UA: NEGATIVE
Nitrite, UA: NEGATIVE
Odor: NEGATIVE
Protein, UA: NEGATIVE
Spec Grav, UA: 1.01 (ref 1.010–1.025)
Urobilinogen, UA: 0.2 E.U./dL
pH, UA: 6 (ref 5.0–8.0)

## 2018-08-09 MED ORDER — OMEPRAZOLE 40 MG PO CPDR: 40.0000 mg | DELAYED_RELEASE_CAPSULE | Freq: Every day | ORAL | 3 refills | Status: DC

## 2018-08-09 NOTE — Progress Notes (Signed)
Subjective:    Patient ID: Leah Barron, female    DOB: [birth year], 78 y.o.   MRN: [id number]  HPI 78 year old Female For health maintenance exam, Medicare wellness, and evaluation of medical issues.  Has had some left knee pain- dx with osteoarthritis and has seen orthopedist. May need injection on return visit she says.  Says stool is dark. Will give 3 hemoccult cards. Takes ibuprofen on prn basis.   Saw ENT physician recently and will have hearing test in the near future.  Medical issues include type 2 diabetes mellitus, microscopic hematuria previously evaluated by urologist, GE reflux, history of anxiety, history of lumbar disc herniation L5-S1, history of adenomatous colon polyps, essential hypertension, obesity and metabolic syndrome.  Patient had cholecystectomy 1998, hysterectomy without oophorectomy 1991, left breast biopsy 1978, bladder biopsies for microscopic hematuria 1980 and 1999 both of which were benign.  In 1996 she suffered a left ankle injury secondary to a fall.  History of recurrent low back pain related to lumbar disc disease.  She is intolerant of codeine and Parafon forte.  Takes hydrocodone APAP if needed for back pain.  Colonoscopy done in 2008.  Social history: She is a widow.  Husband died from complications of cirrhosis of the liver.  One daughter with history of HIV doing well.  One daughter with history of mental illness.  Patient does not smoke or consume alcohol.  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  Family history: She had a total of 6 brothers.  One brother with history of hypertension and kidney failure.  One brother with history of diabetes mellitus coronary artery disease and mental illness.  One brother died of lung cancer.  One sister died with diabetes mellitus heart failure and kidney failure.  One sister died of cardiac arrest.        Review of Systems No new complaints except above.     Objective:   Physical Exam VS  reviewed.  Blood pressure 130/80, pulse 75 regular, temperature 98.2 degrees pulse oximetry 98% weight 186 pounds, BMI 35.1  Skin warm and dry.  Nodes none.  TMs and pharynx are clear.  Neck is supple without JVD thyromegaly or carotid bruits.  Chest is clear to auscultation without rales or wheezing.  Breasts: Normal female without masses.  Cardiac exam regular rate and rhythm normal S1 and S2 without murmurs or gallops.  Abdomen: Obese soft nondistended without hepatosplenomegaly masses or tenderness.  GYN exam deferred; no lower extremity pitting edema.  Neurological exam: No focal deficits on brief neurological exam.  She is alert and oriented x3.  Her behavior is normal and judgment and thought processes are normal.       Assessment & Plan:  BMI 34-does not exercise much due to history of back pain.  Recommend walking for exercise.Marland Kitchen  TSH is normal.  History of ovarian cyst followed by GYN  Type 2 diabetes mellitus-stable on current regimen.  Hemoglobin A1c 6.3% on current regimen.  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  Has complained recently of dark stools.  Is  to submit 3 Hemoccult cards.  History of GE reflux treated with PPI  Plan: Work on diet exercise and weight loss and follow-up in 6 months.  No change in medications.  Submit 3 Hemoccult cards.  Subjective:   Patient presents for Medicare Annual/Subsequent preventive examination.  Review Past Medical/Family/Social: See above   Risk Factors  Current exercise habits: Fairly sedentary needs to walk more  Dietary issues discussed: Low-fat low carbohydrate  Cardiac risk factors: Family history, diabetes, hyperlipidemia  Depression Screen  (Note: if answer to either of the following is "Yes", a more complete depression screening is indicated)   Over the past two weeks, have you felt down, depressed or hopeless? No  Over the past two weeks, have you felt little interest or pleasure in doing things? No Have you lost interest or pleasure in daily life? No Do you often feel hopeless? No Do you cry easily over simple problems? No   Activities of Daily Living  In your present state of health, do you have any difficulty performing the following activities?:   Driving? No  Managing money? No  Feeding yourself? No  Getting from bed to chair? No  Climbing a flight of stairs? No  Preparing food and eating?: No  Bathing or showering? No  Getting dressed: No  Getting to the toilet? No  Using the toilet:No  Moving around from place to place: No  In the past year have you fallen or had a near fall?:No  Are you sexually active? No  Do you have more than one partner? No   Hearing Difficulties: No  Do you often ask people to speak up or repeat themselves?  Yes Do you experience ringing or noises in your ears?  Yes Do you have difficulty understanding soft or whispered voices?  A little Do you feel that you have a problem with memory?  A little Do you often misplace items?  Sometimes   Home Safety:  Do you have a smoke alarm at your residence? Yes Do you have grab bars in the bathroom?  None Do you have throw rugs in your house?  Yes   Cognitive Testing  Alert? Yes Normal Appearance?Yes  Oriented to person? Yes Place? Yes  Time? Yes  Recall of three objects? Yes  Can perform simple calculations? Yes  Displays appropriate judgment?Yes  Can read the correct time from a watch face?Yes   List the Names of Other Physician/Practitioners you currently use:  See referral list for the  physicians patient is currently seeing.     Review of Systems: See above   Objective:     General appearance: Appears stated age and obese  Head: Normocephalic, without obvious abnormality, atraumatic  Eyes: conj clear, EOMi PEERLA  Ears: normal TM's and external ear canals both ears  Nose: Nares normal. Septum midline. Mucosa normal. No drainage or sinus tenderness.  Throat: lips, mucosa, and tongue normal; teeth and gums normal  Neck: no adenopathy, no carotid bruit, no JVD, supple, symmetrical, trachea midline and thyroid not enlarged, symmetric, no tenderness/mass/nodules  No CVA tenderness.  Lungs: clear to auscultation bilaterally  Breasts: normal appearance, no masses or tenderness Heart: regular rate and rhythm, S1, S2 normal, no murmur, click, rub or gallop  Abdomen: soft, non-tender; bowel sounds normal; no masses, no organomegaly  Musculoskeletal: ROM normal in all joints, no crepitus, no deformity, Normal muscle strengthen. Back  is symmetric, no curvature. Skin: Skin color, texture, turgor normal. No rashes or lesions  Lymph nodes:  Cervical, supraclavicular, and axillary nodes normal.  Neurologic: CN 2 -12 Normal, Normal symmetric reflexes. Normal coordination and gait  Psych: Alert & Oriented x 3, Mood appear stable.    Assessment:    Annual wellness medicare exam   Plan:    During the course of the visit the patient was educated and counseled about appropriate screening and preventive services including:   Recommend annual flu vaccine     Patient Instructions (the written plan) was given to the patient.  Medicare Attestation  I have personally reviewed:  The patient's medical and social history  Their use of alcohol, tobacco or illicit drugs  Their current medications and supplements  The patient's functional ability including ADLs,fall risks, home safety risks, cognitive, and hearing and visual impairment  Diet and physical activities  Evidence for  depression or mood disorders  The patient's weight, height, BMI, and visual acuity have been recorded in the chart. I have made referrals, counseling, and provided education to the patient based on review of the above and I have provided the patient with a written personalized care plan for preventive services.

## 2018-08-21 DIAGNOSIS — J3489 Other specified disorders of nose and nasal sinuses: Secondary | ICD-10-CM | POA: Diagnosis not present

## 2018-08-21 DIAGNOSIS — H9071 Mixed conductive and sensorineural hearing loss, unilateral, right ear, with unrestricted hearing on the contralateral side: Secondary | ICD-10-CM | POA: Diagnosis not present

## 2018-08-21 DIAGNOSIS — H6981 Other specified disorders of Eustachian tube, right ear: Secondary | ICD-10-CM | POA: Diagnosis not present

## 2018-08-21 DIAGNOSIS — H6991 Unspecified Eustachian tube disorder, right ear: Secondary | ICD-10-CM | POA: Diagnosis not present

## 2018-08-29 ENCOUNTER — Other Ambulatory Visit: Payer: Self-pay | Admitting: Internal Medicine

## 2018-08-31 ENCOUNTER — Ambulatory Visit (INDEPENDENT_AMBULATORY_CARE_PROVIDER_SITE_OTHER): Payer: Medicare Other | Admitting: Orthopedic Surgery

## 2018-08-31 ENCOUNTER — Encounter: Payer: Self-pay | Admitting: Orthopedic Surgery

## 2018-08-31 DIAGNOSIS — M1711 Unilateral primary osteoarthritis, right knee: Secondary | ICD-10-CM

## 2018-09-01 ENCOUNTER — Encounter: Payer: Self-pay | Admitting: Orthopedic Surgery

## 2018-09-01 NOTE — Progress Notes (Signed)
Office Visit Note   Patient: Leah Barron           Date of Birth: 1940-04-10           MRN: [id number] Visit Date: 08/31/2018 Requested by: Elby Showers, MD [address] [place],  [place] [id number] PCP: Elby Showers, MD  Subjective: Chief Complaint  Patient presents with  . Right Knee - Pain    HPI: Leah Barron is a patient with bilateral knee pain right worse than left.  She is decided that she would like to have her knee aspirated and injected.  Saw her several weeks ago when she was having some arthritic type pain.  In general the pain is getting a little bit more debilitating.  She is very vibrant and active for her age of 77.  She just got back from a revival in [place] region.              ROS: All systems reviewed are negative as they relate to the chief complaint within the history of present illness.  Patient denies  fevers or chills.   Assessment & Plan: Visit Diagnoses:  1. Arthritis of right knee     Plan: Impression is bilateral knee pain with no effusion in the left knee but effusion and arthritis in the right knee.  Plan is aspiration and injection today.  Continue with non-loadbearing quad strengthening exercises.  Follow-up with me as needed.  I think that she may potentially be a candidate for gel injection if this does not give her lasting relief.  Follow-Up Instructions: Return if symptoms worsen or fail to improve.   Orders:  No orders of the defined types were placed in this encounter.  No orders of the defined types were placed in this encounter.     Procedures: No procedures performed   Clinical Data: No additional findings.  Objective: Vital Signs: There were no vitals taken for this visit.  Physical Exam:   Constitutional: Patient appears well-developed HEENT:  Head: Normocephalic Eyes:EOM are normal Neck: Normal range of motion Cardiovascular: Normal rate Pulmonary/chest: Effort normal Neurologic: Patient is alert Skin:  Skin is warm Psychiatric: Patient has normal mood and affect    Ortho Exam: Ortho exam demonstrates pretty normal gait alignment slightly antalgic to the right mild effusion is present extensor mechanism is intact has a 5 degree flexion contracture on the right with palpable pedal pulses and flexion easily past 90.  Collaterals are stable extensor mechanism is intact no groin pain with internal X rotation of the leg.  This patient is diagnosed with osteoarthritis of the knee(s).    Radiographs show evidence of joint space narrowing, osteophytes, subchondral sclerosis and/or subchondral cysts.  This patient has knee pain which interferes with functional and activities of daily living.    This patient has experienced inadequate response, adverse effects and/or intolerance with conservative treatments such as acetaminophen, NSAIDS, topical creams, physical therapy or regular exercise, knee bracing and/or weight loss.   This patient has experienced inadequate response or has a contraindication to intra articular steroid injections for at least 3 months.   This patient is not scheduled to have a total knee replacement within 6 months of starting treatment with viscosupplementation.   Specialty Comments:  No specialty comments available.  Imaging: No results found.   PMFS History: Patient Active Problem List   Diagnosis Date Noted  . Essential hypertension 08/02/2015  . Anxiety 09/05/2011  . Intertrigo 09/05/2011  . History of positive  PPD 09/05/2011  . GERD (gastroesophageal reflux disease) 05/11/2010  . Type 2 diabetes mellitus ([place]) 05/11/2010  . Hyperlipidemia 05/11/2010  . Vitamin D deficiency 05/11/2010  . Hx of adenomatous colonic polyps 05/11/2010  . Lumbar disc herniation 05/11/2010  . Mild allergic rhinitis 05/11/2010  . [place] intertrigo 05/11/2010  . Hematuria, microscopic 05/11/2010  . History of recurrent UTIs 05/11/2010   Past Medical History:  Diagnosis Date   . Allergy   . Anxiety   . Cataract   . Diabetes mellitus   . GERD (gastroesophageal reflux disease)   . Hematuria, microscopic   . History of recurrent UTIs 05/11/2010  . Hyperlipidemia   . Positive TB test   . Vitamin D deficiency     Family History  Problem Relation Age of Onset  . Dementia Mother   . Diabetes Father   . Diabetes Sister   . Heart disease Sister   . Diabetes Brother   . Kidney disease Brother   . HIV Daughter     Past Surgical History:  Procedure Laterality Date  . ABDOMINAL HYSTERECTOMY    . BREAST SURGERY  1978   l breast biopsy  . CHOLECYSTECTOMY    . CYSTOSTOMY W/ BLADDER BIOPSY  1984, 1989   Social History   Occupational History  . Not on file  Tobacco Use  . Smoking status: Never Smoker  . Smokeless tobacco: Never Used  Substance and Sexual Activity  . Alcohol use: No  . Drug use: No  . Sexual activity: Never    Comment: [employer] since age 73

## 2018-09-04 NOTE — Progress Notes (Signed)
Noted  

## 2018-09-14 ENCOUNTER — Telehealth: Payer: Self-pay

## 2018-09-14 NOTE — Telephone Encounter (Signed)
Submitted VOB for SynviscOne, right knee. 

## 2018-09-17 ENCOUNTER — Telehealth: Payer: Self-pay

## 2018-09-17 ENCOUNTER — Other Ambulatory Visit: Payer: Self-pay | Admitting: Internal Medicine

## 2018-09-17 ENCOUNTER — Other Ambulatory Visit: Payer: Self-pay

## 2018-09-17 NOTE — Telephone Encounter (Signed)
Talked with patient to advise her that she is approved for gel injection, right knee, but patient would like to hold off on gel injection and stated that her right knee is doing well at this time.  Approved for SynviscOne, right knee. [place] Patient will be responsible for 20% OOP. Co-pay of $30.00 required No PA required

## 2018-09-17 NOTE — Telephone Encounter (Signed)
Received Fax RX request from  [place], [place]  Medication - ALPRAZolam Duanne Moron) 0.5 MG tablet   Last Refill - 09/13/18  Last OV - 08/09/18  Last CPE - 08/09/18

## 2018-09-17 NOTE — Telephone Encounter (Signed)
Left message to call me back, got [facility] [place]. Which pharmacy does patient use?

## 2018-09-18 MED ORDER — ALPRAZOLAM 0.5 MG PO TABS: 0.5000 mg | ORAL_TABLET | Freq: Two times a day (BID) | ORAL | 5 refills | Status: DC

## 2018-09-18 NOTE — Telephone Encounter (Signed)
[facility] [place]

## 2018-09-22 NOTE — Patient Instructions (Signed)
Encourage diet exercise and weight loss.  To have hearing evaluation.  Follow-up in 6 months.  No change in medications.  Recommend annual flu vaccine.

## 2018-10-03 ENCOUNTER — Encounter: Payer: Self-pay | Admitting: Internal Medicine

## 2018-10-03 LAB — HM DIABETES EYE EXAM

## 2018-10-26 ENCOUNTER — Other Ambulatory Visit: Payer: Self-pay | Admitting: Internal Medicine

## 2018-10-26 NOTE — Telephone Encounter (Signed)
Refill x one year °

## 2018-12-18 ENCOUNTER — Telehealth: Payer: Self-pay

## 2018-12-18 NOTE — Telephone Encounter (Signed)
Make sure she is taking BP meds one hour before checking these readings

## 2018-12-18 NOTE — Telephone Encounter (Signed)
Patient was notified.

## 2018-12-18 NOTE — Telephone Encounter (Signed)
Patient called states her BP has been running high this morning 166/06, 172/102 She would like to come in to be seen. She said she is not having any symptoms.  12/10 143/88 12/11 142/89 12/12 151/89 12/13 138/93 12/14 141/90

## 2019-01-18 ENCOUNTER — Ambulatory Visit: Payer: Medicare Other | Attending: Internal Medicine

## 2019-01-18 DIAGNOSIS — Z23 Encounter for immunization: Secondary | ICD-10-CM | POA: Insufficient documentation

## 2019-01-18 NOTE — Progress Notes (Signed)
   Covid-19 Vaccination Clinic  Name:  Leah Barron    MRN: [id number] DOB: 10-03-1940  01/18/2019  [name] was observed post Covid-19 immunization for 15 minutes without incidence. She was provided with Vaccine Information Sheet and instruction to access the V-Safe system.   [name] was instructed to call 911 with any severe reactions post vaccine: Marland Kitchen Difficulty breathing  . Swelling of your face and throat  . A fast heartbeat  . A bad rash all over your body  . Dizziness and weakness    Immunizations Administered    Name Date Dose VIS Date Route   Pfizer COVID-19 Vaccine 01/18/2019  9:34 AM 0.3 mL 12/14/2018 Intramuscular   Manufacturer: [place]   Lot: [id number]   [place]: [id number]

## 2019-01-24 DIAGNOSIS — Z9842 Cataract extraction status, left eye: Secondary | ICD-10-CM | POA: Diagnosis not present

## 2019-01-24 DIAGNOSIS — H52223 Regular astigmatism, bilateral: Secondary | ICD-10-CM | POA: Diagnosis not present

## 2019-01-24 DIAGNOSIS — Z9841 Cataract extraction status, right eye: Secondary | ICD-10-CM | POA: Diagnosis not present

## 2019-01-24 DIAGNOSIS — H40013 Open angle with borderline findings, low risk, bilateral: Secondary | ICD-10-CM | POA: Diagnosis not present

## 2019-01-24 DIAGNOSIS — E119 Type 2 diabetes mellitus without complications: Secondary | ICD-10-CM | POA: Diagnosis not present

## 2019-01-24 LAB — HM DIABETES EYE EXAM

## 2019-02-05 ENCOUNTER — Other Ambulatory Visit: Payer: Medicare Other | Admitting: Internal Medicine

## 2019-02-05 ENCOUNTER — Other Ambulatory Visit: Payer: Self-pay

## 2019-02-05 DIAGNOSIS — E119 Type 2 diabetes mellitus without complications: Secondary | ICD-10-CM

## 2019-02-05 DIAGNOSIS — E7849 Other hyperlipidemia: Secondary | ICD-10-CM

## 2019-02-06 LAB — HEPATIC FUNCTION PANEL
AG Ratio: 1.5 (calc) (ref 1.0–2.5)
ALT: 15 U/L (ref 6–29)
AST: 16 U/L (ref 10–35)
Albumin: 4.2 g/dL (ref 3.6–5.1)
Alkaline phosphatase (APISO): 74 U/L (ref 37–153)
Bilirubin, Direct: 0.1 mg/dL (ref 0.0–0.2)
Globulin: 2.8 g/dL (ref 1.9–3.7)
Indirect Bilirubin: 0.2 mg/dL (ref 0.2–1.2)
Total Bilirubin: 0.3 mg/dL (ref 0.2–1.2)
Total Protein: 7 g/dL (ref 6.1–8.1)

## 2019-02-06 LAB — LIPID PANEL
Cholesterol: 164 mg/dL
HDL: 51 mg/dL
LDL Cholesterol (Calc): 81 mg/dL
Non-HDL Cholesterol (Calc): 113 mg/dL
Total CHOL/HDL Ratio: 3.2 (calc)
Triglycerides: 227 mg/dL — ABNORMAL HIGH

## 2019-02-06 LAB — HEMOGLOBIN A1C
Hgb A1c MFr Bld: 6.8 % of total Hgb — ABNORMAL HIGH (ref <5.7)
Mean Plasma Glucose: 148 (calc)
eAG (mmol/L): 8.2 (calc)

## 2019-02-07 ENCOUNTER — Ambulatory Visit: Payer: Medicare Other | Attending: Internal Medicine

## 2019-02-07 DIAGNOSIS — Z23 Encounter for immunization: Secondary | ICD-10-CM | POA: Insufficient documentation

## 2019-02-07 NOTE — Progress Notes (Signed)
   Covid-19 Vaccination Clinic  Name:  KOWSAR GRUN    MRN: [id number] DOB: 08-19-1940  02/07/2019  Ms. Ki was observed post Covid-19 immunization for 15 minutes without incidence. She was provided with Vaccine Information Sheet and instruction to access the V-Safe system.   Ms. Kolasinski was instructed to call 911 with any severe reactions post vaccine: Marland Kitchen Difficulty breathing  . Swelling of your face and throat  . A fast heartbeat  . A bad rash all over your body  . Dizziness and weakness    Immunizations Administered    Name Date Dose VIS Date Route   Pfizer COVID-19 Vaccine 02/07/2019  9:21 AM 0.3 mL 12/14/2018 Intramuscular   Manufacturer: [place]   Lot: [id number]   [place]: [id number]

## 2019-02-08 ENCOUNTER — Ambulatory Visit (INDEPENDENT_AMBULATORY_CARE_PROVIDER_SITE_OTHER): Payer: Medicare Other | Admitting: Internal Medicine

## 2019-02-08 ENCOUNTER — Other Ambulatory Visit: Payer: Self-pay

## 2019-02-08 ENCOUNTER — Encounter: Payer: Self-pay | Admitting: Internal Medicine

## 2019-02-08 VITALS — BP 140/90 | HR 78 | Temp 98.0°F | Ht 61.0 in | Wt 190.0 lb

## 2019-02-08 DIAGNOSIS — I10 Essential (primary) hypertension: Secondary | ICD-10-CM

## 2019-02-08 DIAGNOSIS — F411 Generalized anxiety disorder: Secondary | ICD-10-CM

## 2019-02-08 DIAGNOSIS — E785 Hyperlipidemia, unspecified: Secondary | ICD-10-CM

## 2019-02-08 DIAGNOSIS — E1169 Type 2 diabetes mellitus with other specified complication: Secondary | ICD-10-CM

## 2019-02-08 DIAGNOSIS — E8881 Metabolic syndrome: Secondary | ICD-10-CM | POA: Diagnosis not present

## 2019-02-08 DIAGNOSIS — Z6835 Body mass index (BMI) 35.0-35.9, adult: Secondary | ICD-10-CM

## 2019-02-08 DIAGNOSIS — G2581 Restless legs syndrome: Secondary | ICD-10-CM | POA: Diagnosis not present

## 2019-02-08 NOTE — Progress Notes (Signed)
   Subjective:    Patient ID: Leah Barron, female    DOB: Sep 28, 1940, 79 y.o.   MRN: [id number]  HPI  79 year old Female for 6 month recheck. Hgb AIC has increased slightly at 6.8%.  Previously was 6.3% in August.  Triglycerides have increased to 227 from 176.  LDL cholesterol is normal.  Not exercising quite as much.  Liver functions are normal.  Patient has type 2 diabetes mellitus, GE reflux, anxiety, history of lumbar disc herniation L5-S1 with recurrent back pain from time to time, history of adenomatous colon polyps, essential hypertension, obesity and metabolic syndrome.  History of microscopic hematuria.  Left knee osteoarthritis.   Review of Systems no new complaints    Objective:   Physical Exam Blood pressure 140/90 pulse 78 temperature 98 degrees pulse oximetry 98% weight 190 pounds BMI 35.90  Skin warm and dry.  Nodes none.  Neck is supple without JVD thyromegaly or carotid bruits.  Chest clear to auscultation.  Cardiac exam regular rate and rhythm normal S1 and S2 without gallops or murmurs.  No lower extremity pitting edema.  Affect thought and judgment are normal.       Assessment & Plan:  Overall doing well with the pandemic-encourage her to continue to watch diet try to get a bit more exercise when the weather improves.  She is on low-dose Lipitor 10 mg daily.  Takes Xanax for anxiety.  Is on HCTZ and olmesartan for hypertension.  Takes Prilosec generic for GE reflux and is on Metformin for diabetes mellitus.  Plan: She will return in 6 months for health maintenance exam and Medicare wellness visit.  Take COVID-19 vaccine when available.

## 2019-02-11 ENCOUNTER — Encounter: Payer: Self-pay | Admitting: Internal Medicine

## 2019-03-01 ENCOUNTER — Other Ambulatory Visit: Payer: Self-pay

## 2019-03-01 DIAGNOSIS — E119 Type 2 diabetes mellitus without complications: Secondary | ICD-10-CM

## 2019-03-01 MED ORDER — ACCU-CHEK SOFTCLIX LANCETS MISC: 11 refills | Status: DC

## 2019-03-02 NOTE — Patient Instructions (Signed)
It was a pleasure to see you today.  Try to work on increasing your activity by walking more outside.  Continue current medications and follow-up in 6 months.  Take COVID-19 vaccine when available.

## 2019-04-01 ENCOUNTER — Other Ambulatory Visit (INDEPENDENT_AMBULATORY_CARE_PROVIDER_SITE_OTHER): Payer: Medicare Other

## 2019-04-01 ENCOUNTER — Telehealth: Payer: Self-pay | Admitting: Internal Medicine

## 2019-04-01 DIAGNOSIS — M79601 Pain in right arm: Secondary | ICD-10-CM | POA: Diagnosis not present

## 2019-04-01 MED ORDER — IBUPROFEN 800 MG PO TABS: 800.0000 mg | ORAL_TABLET | Freq: Three times a day (TID) | ORAL | 0 refills | Status: DC

## 2019-04-01 NOTE — Telephone Encounter (Signed)
Have spoken personally to patient by phone and refilled med as requested.

## 2019-04-01 NOTE — Telephone Encounter (Signed)
Leah Barron [phone]  Arrianne called to see if she could get a refill on below medication, she has strained her arm, the pain comes and goes.  ibuprofen (ADVIL,MOTRIN) 800 MG tablet   [facility] [place]

## 2019-04-01 NOTE — Telephone Encounter (Signed)
Over a year ago, patient was walking her dog and it jerked her right arm.  She sustained an injury to the right upper arm.  She has in the past had ibuprofen 800 mg 3 times a day on hand for recurrent back pain.  She took some of that and pain improved.  However has had recent recurrent right arm pain.  Spoke with patient by phone.  Offered orthopedic appointment but she does not want to go to the orthopedist.  She simply wants her ibuprofen refilled.  She has pain in her right upper arm not in her shoulder area.  Is able to use the right upper extremity.  We will refill ibuprofen 800 mg 3 times a day as needed for musculoskeletal pain #90 with no refill.  Apply heat or ice to the right upper arm.

## 2019-04-04 ENCOUNTER — Other Ambulatory Visit: Payer: Self-pay

## 2019-04-04 MED ORDER — ACCU-CHEK AVIVA PLUS W/DEVICE KIT: PACK | 0 refills | Status: DC

## 2019-04-09 ENCOUNTER — Other Ambulatory Visit: Payer: Self-pay

## 2019-04-18 ENCOUNTER — Other Ambulatory Visit: Payer: Self-pay | Admitting: Internal Medicine

## 2019-06-05 ENCOUNTER — Other Ambulatory Visit: Payer: Self-pay | Admitting: Internal Medicine

## 2019-06-23 ENCOUNTER — Other Ambulatory Visit: Payer: Self-pay | Admitting: Internal Medicine

## 2019-08-02 ENCOUNTER — Other Ambulatory Visit: Payer: Self-pay | Admitting: Internal Medicine

## 2019-08-07 DIAGNOSIS — Z1231 Encounter for screening mammogram for malignant neoplasm of breast: Secondary | ICD-10-CM | POA: Diagnosis not present

## 2019-08-07 LAB — HM MAMMOGRAPHY

## 2019-08-09 ENCOUNTER — Other Ambulatory Visit: Payer: Self-pay

## 2019-08-09 ENCOUNTER — Encounter: Payer: Self-pay | Admitting: Internal Medicine

## 2019-08-09 ENCOUNTER — Other Ambulatory Visit: Payer: Medicare Other | Admitting: Internal Medicine

## 2019-08-09 DIAGNOSIS — Z1329 Encounter for screening for other suspected endocrine disorder: Secondary | ICD-10-CM

## 2019-08-09 DIAGNOSIS — E119 Type 2 diabetes mellitus without complications: Secondary | ICD-10-CM

## 2019-08-09 DIAGNOSIS — E8881 Metabolic syndrome: Secondary | ICD-10-CM

## 2019-08-09 DIAGNOSIS — I10 Essential (primary) hypertension: Secondary | ICD-10-CM

## 2019-08-09 DIAGNOSIS — E7849 Other hyperlipidemia: Secondary | ICD-10-CM | POA: Diagnosis not present

## 2019-08-09 DIAGNOSIS — E1169 Type 2 diabetes mellitus with other specified complication: Secondary | ICD-10-CM | POA: Diagnosis not present

## 2019-08-09 DIAGNOSIS — G2581 Restless legs syndrome: Secondary | ICD-10-CM

## 2019-08-09 DIAGNOSIS — E785 Hyperlipidemia, unspecified: Secondary | ICD-10-CM | POA: Diagnosis not present

## 2019-08-09 DIAGNOSIS — Z0000 Encounter for general adult medical examination without abnormal findings: Secondary | ICD-10-CM

## 2019-08-10 LAB — COMPLETE METABOLIC PANEL WITH GFR
AG Ratio: 1.5 (calc) (ref 1.0–2.5)
ALT: 12 U/L (ref 6–29)
AST: 14 U/L (ref 10–35)
Albumin: 4.1 g/dL (ref 3.6–5.1)
Alkaline phosphatase (APISO): 74 U/L (ref 37–153)
BUN/Creatinine Ratio: 16 (calc) (ref 6–22)
BUN: 16 mg/dL (ref 7–25)
CO2: 28 mmol/L (ref 20–32)
Calcium: 9.5 mg/dL (ref 8.6–10.4)
Chloride: 105 mmol/L (ref 98–110)
Creat: 0.98 mg/dL — ABNORMAL HIGH (ref 0.60–0.93)
GFR, Est African American: 64 mL/min/{1.73_m2} (ref >=60)
GFR, Est Non African American: 55 mL/min/{1.73_m2} — ABNORMAL LOW (ref >=60)
Globulin: 2.8 g/dL (calc) (ref 1.9–3.7)
Glucose, Bld: 121 mg/dL — ABNORMAL HIGH (ref 65–99)
Potassium: 3.8 mmol/L (ref 3.5–5.3)
Sodium: 144 mmol/L (ref 135–146)
Total Bilirubin: 0.5 mg/dL (ref 0.2–1.2)
Total Protein: 6.9 g/dL (ref 6.1–8.1)

## 2019-08-10 LAB — CBC WITH DIFFERENTIAL/PLATELET
Absolute Monocytes: 351 cells/uL (ref 200–950)
Basophils Absolute: 22 cells/uL (ref 0–200)
Basophils Relative: 0.4 %
Eosinophils Absolute: 81 cells/uL (ref 15–500)
Eosinophils Relative: 1.5 %
HCT: 37.2 % (ref 35.0–45.0)
Hemoglobin: 12.6 g/dL (ref 11.7–15.5)
Lymphs Abs: 1696 cells/uL (ref 850–3900)
MCH: 31.1 pg (ref 27.0–33.0)
MCHC: 33.9 g/dL (ref 32.0–36.0)
MCV: 91.9 fL (ref 80.0–100.0)
MPV: 9.7 fL (ref 7.5–12.5)
Monocytes Relative: 6.5 %
Neutro Abs: 3251 cells/uL (ref 1500–7800)
Neutrophils Relative %: 60.2 %
Platelets: 252 10*3/uL (ref 140–400)
RBC: 4.05 10*6/uL (ref 3.80–5.10)
RDW: 13.1 % (ref 11.0–15.0)
Total Lymphocyte: 31.4 %
WBC: 5.4 10*3/uL (ref 3.8–10.8)

## 2019-08-10 LAB — LIPID PANEL
Cholesterol: 174 mg/dL (ref <200)
HDL: 52 mg/dL (ref >=50)
LDL Cholesterol (Calc): 92 mg/dL (calc)
Non-HDL Cholesterol (Calc): 122 mg/dL (calc) (ref <130)
Total CHOL/HDL Ratio: 3.3 (calc) (ref <5.0)
Triglycerides: 204 mg/dL — ABNORMAL HIGH (ref <150)

## 2019-08-10 LAB — HEMOGLOBIN A1C
Hgb A1c MFr Bld: 6.3 % of total Hgb — ABNORMAL HIGH (ref <5.7)
Mean Plasma Glucose: 134 (calc)
eAG (mmol/L): 7.4 (calc)

## 2019-08-10 LAB — TSH: TSH: 0.87 mIU/L (ref 0.40–4.50)

## 2019-08-13 ENCOUNTER — Other Ambulatory Visit: Payer: Self-pay

## 2019-08-13 ENCOUNTER — Ambulatory Visit (INDEPENDENT_AMBULATORY_CARE_PROVIDER_SITE_OTHER): Payer: Medicare Other | Admitting: Internal Medicine

## 2019-08-13 ENCOUNTER — Encounter: Payer: Self-pay | Admitting: Internal Medicine

## 2019-08-13 VITALS — BP 170/100 | HR 72 | Ht 60.5 in | Wt 180.0 lb

## 2019-08-13 DIAGNOSIS — Z0000 Encounter for general adult medical examination without abnormal findings: Secondary | ICD-10-CM | POA: Diagnosis not present

## 2019-08-13 DIAGNOSIS — G8929 Other chronic pain: Secondary | ICD-10-CM

## 2019-08-13 DIAGNOSIS — F411 Generalized anxiety disorder: Secondary | ICD-10-CM

## 2019-08-13 DIAGNOSIS — R002 Palpitations: Secondary | ICD-10-CM

## 2019-08-13 DIAGNOSIS — I10 Essential (primary) hypertension: Secondary | ICD-10-CM

## 2019-08-13 DIAGNOSIS — M545 Low back pain, unspecified: Secondary | ICD-10-CM

## 2019-08-13 DIAGNOSIS — Z634 Disappearance and death of family member: Secondary | ICD-10-CM

## 2019-08-13 DIAGNOSIS — E1169 Type 2 diabetes mellitus with other specified complication: Secondary | ICD-10-CM

## 2019-08-13 DIAGNOSIS — F4321 Adjustment disorder with depressed mood: Secondary | ICD-10-CM | POA: Diagnosis not present

## 2019-08-13 DIAGNOSIS — R3129 Other microscopic hematuria: Secondary | ICD-10-CM

## 2019-08-13 DIAGNOSIS — I491 Atrial premature depolarization: Secondary | ICD-10-CM

## 2019-08-13 LAB — POCT URINALYSIS DIPSTICK
Appearance: NEGATIVE
Bilirubin, UA: NEGATIVE
Blood, UA: NEGATIVE
Glucose, UA: NEGATIVE
Ketones, UA: NEGATIVE
Leukocytes, UA: NEGATIVE
Nitrite, UA: NEGATIVE
Odor: NEGATIVE
Protein, UA: NEGATIVE
Spec Grav, UA: 1.01
Urobilinogen, UA: 0.2 U/dL
pH, UA: 6.5

## 2019-08-13 MED ORDER — ALPRAZOLAM 0.5 MG PO TABS: 0.5000 mg | ORAL_TABLET | Freq: Two times a day (BID) | ORAL | 3 refills | Status: DC

## 2019-08-13 NOTE — Progress Notes (Signed)
Subjective:    Patient ID: Leah Barron, female    DOB: June 26, 1940, 79 y.o.   MRN: [id number]  HPI 79 year old Female in today for Medicare Wellness, health maintenance exam and evaluation of medical issues.  She lost her daughter, Leah Barron, to hepatic cancer recently.  She is still grieving.  We talked about this at length today.  Patient help take care of her daughter during her illness.  This was difficult.  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  History of left knee osteoarthritis.  History of adenomatous colon polyps and obesity.  History of microscopic hematuria previously evaluated by urologist.  Patient had cholecystectomy 1998, hysterectomy without oophorectomy 1991, left breast biopsy 1978, bladder biopsies for microscopic hematuria in 1988 in 1999 both of which were benign.  In 1996 she suffered a left ankle injury secondary to a fall.  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  She is intolerant of codeine and Parafon forte.  Takes hydrocodone APAP sparingly as needed for back pain.  A colonoscopy in 2008.  Social history: She is a widow.  Husband died from complications of cirrhosis of the liver a number of years ago.  1 daughter living with history of HIV doing well.  1 daughter with history of mental illness living.  Patient does not smoke or consume alcohol.  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  Family history: She has a total of 6 brothers.  1 brother with history of hypertension and kidney failure.  1 brother with history of diabetes mellitus, coronary artery disease and mental illness.  1 brother died of lung cancer.  1 sister died with diabetes mellitus, heart failure and kidney failure.  1 sister died of cardiac arrest.    Review of Systems clearly grieving loss of daughter     Objective:   Physical Exam Blood pressure 170/100,  pulse 72 pulse oximetry 98% weight 180 pounds BMI 34.58 skin warm and dry.  No cervical adenopathy.  No thyromegaly.  No carotid bruits.  TMs are clear.  Chest clear.  Cardiac exam: Frequent extrasystoles today and EKG shows PACs breast without masses.  Abdomen obese soft nondistended without hepatosplenomegaly masses or tenderness.  No lower extremity edema.  Neuro intact without gross focal deficits.  Affect is sad due to loss of daughter.       Assessment & Plan:  Elevated blood pressure reading due to grief reaction and explained loss of daughter  Anxiety longstanding treated with Xanax  Essential hypertension elevated blood pressure reading due to anxiety degree Type 2 diabetes mellitus-hemoglobin A1c 6.3%  Microscopic hematuria previously evaluated by urologist  GE reflux treated with PPI  PACs due to anxiety and stress-documented on EKG  History of lumbar disc herniation L5-S1 treated with anti-inflammatory medication and occasional hydrocodone APAP  History of adenomatous colon polyps  Obesity  Metabolic syndrome  Grief reaction due to loss of daughter  Plan: Blood pressure is elevated today we will have patient return in a couple of weeks for blood pressure check.  She was upset and anxious today in the office due to loss of her daughter and grief reaction.  Return around September 10 for blood pressure check.  Subjective:   Patient presents for Medicare Annual/Subsequent preventive examination.  Review Past Medical/Family/Social: See above   Risk Factors  Current exercise habits: Mostly sedentary- walks a little Dietary issues  discussed: Low-fat low carbohydrate  Cardiac risk factors: Hyperlipidemia, diabetes mellitus, family history  Depression Screen  (Note: if answer to either of the following is "Yes", a more complete depression screening is indicated)   Over the past two weeks, have you felt down, depressed or hopeless?  Yes due to loss of my  daughter Over the past two weeks, have you felt little interest or pleasure in doing things?  Yes due to grief with loss of my daughter Have you lost interest or pleasure in daily life?  Yes see above Do you often feel hopeless? No Do you cry easily over simple problems? No   Activities of Daily Living  In your present state of health, do you have any difficulty performing the following activities?:   Driving? No  Managing money? No  Feeding yourself? No  Getting from bed to chair? No  Climbing a flight of stairs? No  Preparing food and eating?: No  Bathing or showering? No  Getting dressed: No  Getting to the toilet? No  Using the toilet:No  Moving around from place to place: No  In the past year have you fallen or had a near fall?:No  Are you sexually active? No  Do you have more than one partner? No   Hearing Difficulties: No  Do you often ask people to speak up or repeat themselves?  Sometimes Do you experience ringing or noises in your ears?  Yes Do you have difficulty understanding soft or whispered voices?  Sometimes Do you feel that you have a problem with memory?  Sometimes Do you often misplace items?  Sometimes   Home Safety:  Do you have a smoke alarm at your residence? Yes Do you have grab bars in the bathroom? Do you have throw rugs in your house?   Cognitive Testing  Alert? Yes Normal Appearance?Yes  Oriented to person? Yes Place? Yes  Time? Yes  Recall of three objects? Yes  Can perform simple calculations? Yes  Displays appropriate judgment?Yes  Can read the correct time from a watch face?Yes   List the Names of Other Physician/Practitioners you currently use:  See referral list for the physicians patient is currently seeing.     Review of Systems: See above   Objective:     General appearance: Appears stated age and mildly obese  Head: Normocephalic, without obvious abnormality, atraumatic  Eyes: conj clear, EOMi PEERLA  Ears: normal TM's  and external ear canals both ears  Nose: Nares normal. Septum midline. Mucosa normal. No drainage or sinus tenderness.  Throat: lips, mucosa, and tongue normal; teeth and gums normal  Neck: no adenopathy, no carotid bruit, no JVD, supple, symmetrical, trachea midline and thyroid not enlarged, symmetric, no tenderness/mass/nodules  No CVA tenderness.  Lungs: clear to auscultation bilaterally  Breasts: normal appearance, no masses or tenderness Heart: regular rate and rhythm, S1, S2 normal, no murmur, click, rub or gallop  Abdomen: soft, non-tender; bowel sounds normal; no masses, no organomegaly  Musculoskeletal: ROM normal in all joints, no crepitus, no deformity, Normal muscle strengthen. Back  is symmetric, no curvature. Skin: Skin color, texture, turgor normal. No rashes or lesions  Lymph nodes: Cervical, supraclavicular, and axillary nodes normal.  Neurologic: CN 2 -12 Normal, Normal symmetric reflexes. Normal coordination and gait  Psych: Alert & Oriented x 3, Mood appear stable.    Assessment:    Annual wellness medicare exam   Plan:    During the course of the visit the patient was educated  and counseled about appropriate screening and preventive services including:   Has had 2 COVID-19 immunizations and should get booster.  Has had 2 pneumococcal vaccines.  Tetanus immunization is still up-to-date.  Has had Shingrix vaccine.  Gets annual flu vaccine.     Patient Instructions (the written plan) was given to the patient.  Medicare Attestation  I have personally reviewed:  The patient's medical and social history  Their use of alcohol, tobacco or illicit drugs  Their current medications and supplements  The patient's functional ability including ADLs,fall risks, home safety risks, cognitive, and hearing and visual impairment  Diet and physical activities  Evidence for depression or mood disorders  The patient's weight, height, BMI, and visual acuity have been recorded in  the chart. I have made referrals, counseling, and provided education to the patient based on review of the above and I have provided the patient with a written personalized care plan for preventive services.

## 2019-08-16 ENCOUNTER — Telehealth: Payer: Self-pay | Admitting: Radiology

## 2019-08-16 NOTE — Telephone Encounter (Signed)
Enrolled patient for a 3 day Zio monitor to be mailed to patients home. Brief instructions were gone over with the patient and she knows to expect the monitor to arrive in 3-4 days. Patient is aware per ordering MD she only needs to wear for 1 day.

## 2019-08-21 ENCOUNTER — Other Ambulatory Visit (INDEPENDENT_AMBULATORY_CARE_PROVIDER_SITE_OTHER): Payer: Medicare Other

## 2019-08-21 DIAGNOSIS — R002 Palpitations: Secondary | ICD-10-CM

## 2019-09-04 DIAGNOSIS — R002 Palpitations: Secondary | ICD-10-CM | POA: Diagnosis not present

## 2019-09-13 ENCOUNTER — Ambulatory Visit (INDEPENDENT_AMBULATORY_CARE_PROVIDER_SITE_OTHER): Payer: Medicare Other | Admitting: Internal Medicine

## 2019-09-13 ENCOUNTER — Other Ambulatory Visit: Payer: Self-pay

## 2019-09-13 VITALS — BP 120/70 | HR 78 | Ht 60.5 in | Wt 181.0 lb

## 2019-09-13 DIAGNOSIS — Z634 Disappearance and death of family member: Secondary | ICD-10-CM | POA: Diagnosis not present

## 2019-09-13 DIAGNOSIS — F4321 Adjustment disorder with depressed mood: Secondary | ICD-10-CM

## 2019-09-13 DIAGNOSIS — I10 Essential (primary) hypertension: Secondary | ICD-10-CM

## 2019-09-13 DIAGNOSIS — I491 Atrial premature depolarization: Secondary | ICD-10-CM

## 2019-09-13 DIAGNOSIS — F411 Generalized anxiety disorder: Secondary | ICD-10-CM

## 2019-09-13 NOTE — Progress Notes (Signed)
   Subjective:    Patient ID: Leah Barron, female    DOB: [birth year], 79 y.o.   MRN: [id number]  HPI 79 year old Female for follow up on elevated BP and PACs noted on EKG at last visit.  At that time she had relayed to me that her daughter had passed away and she was visibly upset.  I think this triggered the palpitations and elevated blood pressure.  Today she is feeling better.    Review of Systems no chest pain or SOB     Objective:   Physical Exam Blood pressure today is 120/70.  Pulse is 78 and more regular although there are occasional extrasystoles.  Weight 181 pounds.  Neck is supple.  No carotid bruits.  Chest is clear to auscultation.  She has a 1/6  systolic ejection murmur on cardiac exam.  Extremities  without edema.       Assessment & Plan:  Grief reaction  History of anxiety treated with Xanax  Elevated blood pressure reading at last visit is now improved and blood pressure is normal  History of PACs-she had long-term monitor for a number of days showing sinus bradycardia, normal sinus rhythm and sinus tachycardia.  Average heart rate was 74 bpm.  She did have some nonsustained atrial tachycardia up to 7 beats with occasional PACs and PVCs.  This is acceptable for her age.  Plan: Patient will be seen again for 58-month follow-up in February 2022.  She will continue with current medications.

## 2019-09-19 ENCOUNTER — Encounter: Payer: Self-pay | Admitting: Internal Medicine

## 2019-09-19 DIAGNOSIS — M8589 Other specified disorders of bone density and structure, multiple sites: Secondary | ICD-10-CM | POA: Diagnosis not present

## 2019-09-21 NOTE — Patient Instructions (Signed)
We are sorry to hear about the passing of your daughter.  Please take care of yourself.  Follow-up here in a month for repeat blood pressure check.

## 2019-09-23 ENCOUNTER — Encounter: Payer: Self-pay | Admitting: Internal Medicine

## 2019-09-29 ENCOUNTER — Encounter: Payer: Self-pay | Admitting: Internal Medicine

## 2019-09-29 NOTE — Patient Instructions (Signed)
Your Holter monitor has been reviewed and is within normal limits for your age.  Continue current medications.  Return in February for follow-up.  Your blood pressure is stable and improved.

## 2019-10-28 ENCOUNTER — Other Ambulatory Visit: Payer: Self-pay | Admitting: Internal Medicine

## 2019-11-27 ENCOUNTER — Telehealth: Payer: Self-pay

## 2019-11-27 NOTE — Telephone Encounter (Signed)
Patient called her BP in the mornings has been running high, 150/90, 150/98 156/90 an hour after taking her bp medication.  When she called her BP was 150/98. I told her to wait 3-4 hours after taking her BP medication and then check it, she checked it after 4 hours and it was 132/78. She wants to know if there is anything she needs to do.

## 2019-11-27 NOTE — Telephone Encounter (Signed)
I think the medication takes time to get in her system after she takes it The more anxious she is,the higher it is. BP does not stay the same all day long.

## 2020-01-15 ENCOUNTER — Other Ambulatory Visit: Payer: Self-pay | Admitting: Internal Medicine

## 2020-01-29 DIAGNOSIS — Z20822 Contact with and (suspected) exposure to covid-19: Secondary | ICD-10-CM | POA: Diagnosis not present

## 2020-02-07 ENCOUNTER — Other Ambulatory Visit: Payer: Self-pay

## 2020-02-07 ENCOUNTER — Other Ambulatory Visit: Payer: Medicare Other | Admitting: Internal Medicine

## 2020-02-07 DIAGNOSIS — I10 Essential (primary) hypertension: Secondary | ICD-10-CM | POA: Diagnosis not present

## 2020-02-07 DIAGNOSIS — E1169 Type 2 diabetes mellitus with other specified complication: Secondary | ICD-10-CM

## 2020-02-07 DIAGNOSIS — R002 Palpitations: Secondary | ICD-10-CM | POA: Diagnosis not present

## 2020-02-07 DIAGNOSIS — M545 Low back pain, unspecified: Secondary | ICD-10-CM | POA: Diagnosis not present

## 2020-02-07 DIAGNOSIS — G8929 Other chronic pain: Secondary | ICD-10-CM

## 2020-02-07 DIAGNOSIS — E785 Hyperlipidemia, unspecified: Secondary | ICD-10-CM | POA: Diagnosis not present

## 2020-02-08 LAB — HEMOGLOBIN A1C
Hgb A1c MFr Bld: 6.6 % of total Hgb — ABNORMAL HIGH (ref <5.7)
Mean Plasma Glucose: 143 mg/dL
eAG (mmol/L): 7.9 mmol/L

## 2020-02-08 LAB — LIPID PANEL
Cholesterol: 164 mg/dL (ref <200)
HDL: 55 mg/dL (ref >=50)
LDL Cholesterol (Calc): 82 mg/dL (calc)
Non-HDL Cholesterol (Calc): 109 mg/dL (calc) (ref <130)
Total CHOL/HDL Ratio: 3 (calc) (ref <5.0)
Triglycerides: 170 mg/dL — ABNORMAL HIGH (ref <150)

## 2020-02-08 LAB — HEPATIC FUNCTION PANEL
AG Ratio: 1.6 (calc) (ref 1.0–2.5)
ALT: 10 U/L (ref 6–29)
AST: 12 U/L (ref 10–35)
Albumin: 4.2 g/dL (ref 3.6–5.1)
Alkaline phosphatase (APISO): 73 U/L (ref 37–153)
Bilirubin, Direct: 0.1 mg/dL (ref 0.0–0.2)
Globulin: 2.6 g/dL (calc) (ref 1.9–3.7)
Indirect Bilirubin: 0.4 mg/dL (calc) (ref 0.2–1.2)
Total Bilirubin: 0.5 mg/dL (ref 0.2–1.2)
Total Protein: 6.8 g/dL (ref 6.1–8.1)

## 2020-02-14 ENCOUNTER — Other Ambulatory Visit: Payer: Self-pay

## 2020-02-14 ENCOUNTER — Encounter: Payer: Self-pay | Admitting: Internal Medicine

## 2020-02-14 ENCOUNTER — Ambulatory Visit (INDEPENDENT_AMBULATORY_CARE_PROVIDER_SITE_OTHER): Payer: Medicare Other | Admitting: Internal Medicine

## 2020-02-14 VITALS — BP 150/100 | HR 72 | Ht 60.5 in | Wt 185.0 lb

## 2020-02-14 DIAGNOSIS — E1169 Type 2 diabetes mellitus with other specified complication: Secondary | ICD-10-CM

## 2020-02-14 DIAGNOSIS — Z6835 Body mass index (BMI) 35.0-35.9, adult: Secondary | ICD-10-CM

## 2020-02-14 DIAGNOSIS — E8881 Metabolic syndrome: Secondary | ICD-10-CM | POA: Diagnosis not present

## 2020-02-14 DIAGNOSIS — E785 Hyperlipidemia, unspecified: Secondary | ICD-10-CM | POA: Diagnosis not present

## 2020-02-14 DIAGNOSIS — F411 Generalized anxiety disorder: Secondary | ICD-10-CM

## 2020-02-14 DIAGNOSIS — I10 Essential (primary) hypertension: Secondary | ICD-10-CM | POA: Diagnosis not present

## 2020-02-14 MED ORDER — METOPROLOL TARTRATE 25 MG PO TABS: 25.0000 mg | ORAL_TABLET | Freq: Every day | ORAL | 1 refills | Status: DC

## 2020-02-14 MED ORDER — ATORVASTATIN CALCIUM 20 MG PO TABS: 20.0000 mg | ORAL_TABLET | Freq: Every day | ORAL | 3 refills | Status: DC

## 2020-02-14 NOTE — Patient Instructions (Addendum)
Increase generic Lipitor to 20 mg daily.  Adding metoprolol 25 mg daily to blood pressure regimen.  Watch diet.  Try to lose weight.  Follow-up in April.

## 2020-02-14 NOTE — Progress Notes (Signed)
   Subjective:    Patient ID: Leah Barron, female    DOB: 1940/06/29, 80 y.o.   MRN: [id number]  HPI 80 year old Female seen for 36-month follow-up.  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  History of microscopic hematuria previously evaluated by urologist.  Social history: She is a widow and resides alone.  She lost a daughter to liver cancer in the Summer 2021.  1 daughter living with history of HIV doing well.  She is a Programme researcher, broadcasting/film/video.     Review of Systems no new complaints     Objective:   Physical Exam Blood pressure 150/100.  Says driving in [place] drives her blood pressure up and it it is better controlled at home.  Pulse is 72 and regular pulse oximetry 98% weight 185 pounds.  BMI 35.54.  We rechecked her blood pressure and it remained elevated.  Skin is warm and dry.  Neck is supple.  No thyromegaly.  No carotid bruits.  Chest is clear to auscultation without rales or wheezing.  Cardiac exam: Regular rate and rhythm normal S1 and S2 without murmurs or gallops.  No lower extremity pitting edema.  Affect felt judgment are normal.       Assessment & Plan:   Mixed hyperlipidemia.Increase generic atorvastatin to 20 mg daily.  Liver functions are normal.  Essential HTN- has element of anxiety and office HTN. Is on HCTZ and Benicar 20 mg daily.  Have added metoprolol 25 mg daily to this regimen.  Type 2 diabetes mellitus treated with Glucophage XR 500 mg twice daily.  Hemoglobin A1c has increased from 6.3% in August to 6.6%.  Encourage diet exercise and weight loss.  BMI 35-try to lose weight and at least walk some.  Watch diet.  Plan: She will return for follow-up in April particularly for follow-up on hypertension and hyperlipidemia.

## 2020-03-03 ENCOUNTER — Other Ambulatory Visit: Payer: Self-pay | Admitting: Internal Medicine

## 2020-03-03 DIAGNOSIS — E119 Type 2 diabetes mellitus without complications: Secondary | ICD-10-CM

## 2020-03-13 ENCOUNTER — Encounter: Payer: Self-pay | Admitting: Internal Medicine

## 2020-03-13 DIAGNOSIS — H04123 Dry eye syndrome of bilateral lacrimal glands: Secondary | ICD-10-CM | POA: Diagnosis not present

## 2020-03-13 DIAGNOSIS — Z9841 Cataract extraction status, right eye: Secondary | ICD-10-CM | POA: Diagnosis not present

## 2020-03-13 DIAGNOSIS — E119 Type 2 diabetes mellitus without complications: Secondary | ICD-10-CM | POA: Diagnosis not present

## 2020-03-13 DIAGNOSIS — H40013 Open angle with borderline findings, low risk, bilateral: Secondary | ICD-10-CM | POA: Diagnosis not present

## 2020-03-13 DIAGNOSIS — Z9842 Cataract extraction status, left eye: Secondary | ICD-10-CM | POA: Diagnosis not present

## 2020-03-13 DIAGNOSIS — H52223 Regular astigmatism, bilateral: Secondary | ICD-10-CM | POA: Diagnosis not present

## 2020-03-13 LAB — HM DIABETES EYE EXAM

## 2020-03-22 ENCOUNTER — Encounter: Payer: Self-pay | Admitting: Internal Medicine

## 2020-04-06 ENCOUNTER — Other Ambulatory Visit: Payer: Self-pay

## 2020-04-06 ENCOUNTER — Encounter: Payer: Self-pay | Admitting: Internal Medicine

## 2020-04-06 ENCOUNTER — Ambulatory Visit (INDEPENDENT_AMBULATORY_CARE_PROVIDER_SITE_OTHER): Payer: Medicare Other | Admitting: Internal Medicine

## 2020-04-06 VITALS — BP 120/80 | HR 60 | Ht 60.5 in | Wt 190.0 lb

## 2020-04-06 DIAGNOSIS — E785 Hyperlipidemia, unspecified: Secondary | ICD-10-CM

## 2020-04-06 DIAGNOSIS — E781 Pure hyperglyceridemia: Secondary | ICD-10-CM | POA: Diagnosis not present

## 2020-04-06 DIAGNOSIS — E1169 Type 2 diabetes mellitus with other specified complication: Secondary | ICD-10-CM

## 2020-04-06 DIAGNOSIS — I10 Essential (primary) hypertension: Secondary | ICD-10-CM

## 2020-04-06 DIAGNOSIS — E8881 Metabolic syndrome: Secondary | ICD-10-CM

## 2020-04-06 DIAGNOSIS — Z6836 Body mass index (BMI) 36.0-36.9, adult: Secondary | ICD-10-CM

## 2020-04-06 DIAGNOSIS — F411 Generalized anxiety disorder: Secondary | ICD-10-CM
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
## 2020-04-06 NOTE — Patient Instructions (Signed)
It was a pleasure to see you today.  Continue current medications.  Lipid panel drawn with results pending.  We will contact you about this test.  Continue current antihypertensive medication.  Continue to watch diet.  We will book physical examination and Medicare wellness visit for August 2022.

## 2020-04-07 LAB — LIPID PANEL
Cholesterol: 154 mg/dL (ref <200)
HDL: 55 mg/dL (ref >=50)
LDL Cholesterol (Calc): 73 mg/dL (calc)
Non-HDL Cholesterol (Calc): 99 mg/dL (calc) (ref <130)
Total CHOL/HDL Ratio: 2.8 (calc) (ref <5.0)
Triglycerides: 179 mg/dL — ABNORMAL HIGH (ref <150)

## 2020-04-07 LAB — HEPATIC FUNCTION PANEL
AG Ratio: 1.5 (calc) (ref 1.0–2.5)
ALT: 14 U/L (ref 6–29)
AST: 15 U/L (ref 10–35)
Albumin: 4.3 g/dL (ref 3.6–5.1)
Alkaline phosphatase (APISO): 76 U/L (ref 37–153)
Bilirubin, Direct: 0.1 mg/dL (ref 0.0–0.2)
Globulin: 2.8 g/dL (calc) (ref 1.9–3.7)
Indirect Bilirubin: 0.4 mg/dL (calc) (ref 0.2–1.2)
Total Bilirubin: 0.5 mg/dL (ref 0.2–1.2)
Total Protein: 7.1 g/dL (ref 6.1–8.1)

## 2020-05-18 DIAGNOSIS — K5730 Diverticulosis of large intestine without perforation or abscess without bleeding: Secondary | ICD-10-CM | POA: Diagnosis not present

## 2020-06-04 ENCOUNTER — Other Ambulatory Visit: Payer: Self-pay | Admitting: Internal Medicine

## 2020-06-25 ENCOUNTER — Other Ambulatory Visit: Payer: Self-pay | Admitting: Internal Medicine

## 2020-07-17 ENCOUNTER — Other Ambulatory Visit: Payer: Self-pay | Admitting: Internal Medicine

## 2020-07-23 ENCOUNTER — Other Ambulatory Visit: Payer: Self-pay | Admitting: Internal Medicine

## 2020-07-23 DIAGNOSIS — E119 Type 2 diabetes mellitus without complications: Secondary | ICD-10-CM

## 2020-08-14 ENCOUNTER — Encounter: Payer: Medicare Other | Admitting: Internal Medicine

## 2020-09-03 ENCOUNTER — Other Ambulatory Visit: Payer: Medicare Other | Admitting: Internal Medicine

## 2020-09-03 ENCOUNTER — Other Ambulatory Visit: Payer: Self-pay

## 2020-09-03 DIAGNOSIS — G8929 Other chronic pain: Secondary | ICD-10-CM

## 2020-09-03 DIAGNOSIS — E785 Hyperlipidemia, unspecified: Secondary | ICD-10-CM

## 2020-09-03 DIAGNOSIS — E1169 Type 2 diabetes mellitus with other specified complication: Secondary | ICD-10-CM | POA: Diagnosis not present

## 2020-09-03 DIAGNOSIS — E8881 Metabolic syndrome: Secondary | ICD-10-CM

## 2020-09-03 DIAGNOSIS — R002 Palpitations: Secondary | ICD-10-CM | POA: Diagnosis not present

## 2020-09-03 DIAGNOSIS — E119 Type 2 diabetes mellitus without complications: Secondary | ICD-10-CM

## 2020-09-03 DIAGNOSIS — G2581 Restless legs syndrome: Secondary | ICD-10-CM

## 2020-09-03 DIAGNOSIS — Z0000 Encounter for general adult medical examination without abnormal findings: Secondary | ICD-10-CM

## 2020-09-03 DIAGNOSIS — I10 Essential (primary) hypertension: Secondary | ICD-10-CM | POA: Diagnosis not present

## 2020-09-04 LAB — COMPLETE METABOLIC PANEL WITH GFR
AG Ratio: 1.5 (calc) (ref 1.0–2.5)
ALT: 15 U/L (ref 6–29)
AST: 15 U/L (ref 10–35)
Albumin: 4.3 g/dL (ref 3.6–5.1)
Alkaline phosphatase (APISO): 73 U/L (ref 37–153)
BUN/Creatinine Ratio: 17 (calc) (ref 6–22)
BUN: 17 mg/dL (ref 7–25)
CO2: 30 mmol/L (ref 20–32)
Calcium: 9.6 mg/dL (ref 8.6–10.4)
Chloride: 104 mmol/L (ref 98–110)
Creat: 1.01 mg/dL — ABNORMAL HIGH (ref 0.60–0.95)
Globulin: 2.8 g/dL (calc) (ref 1.9–3.7)
Glucose, Bld: 130 mg/dL — ABNORMAL HIGH (ref 65–99)
Potassium: 3.7 mmol/L (ref 3.5–5.3)
Sodium: 142 mmol/L (ref 135–146)
Total Bilirubin: 0.5 mg/dL (ref 0.2–1.2)
Total Protein: 7.1 g/dL (ref 6.1–8.1)
eGFR: 56 mL/min/{1.73_m2} — ABNORMAL LOW (ref >=60)

## 2020-09-04 LAB — CBC WITH DIFFERENTIAL/PLATELET
Absolute Monocytes: 431 cells/uL (ref 200–950)
Basophils Absolute: 18 cells/uL (ref 0–200)
Basophils Relative: 0.3 %
Eosinophils Absolute: 41 cells/uL (ref 15–500)
Eosinophils Relative: 0.7 %
HCT: 39.6 % (ref 35.0–45.0)
Hemoglobin: 12.9 g/dL (ref 11.7–15.5)
Lymphs Abs: 2136 cells/uL (ref 850–3900)
MCH: 30.7 pg (ref 27.0–33.0)
MCHC: 32.6 g/dL (ref 32.0–36.0)
MCV: 94.3 fL (ref 80.0–100.0)
MPV: 9.4 fL (ref 7.5–12.5)
Monocytes Relative: 7.3 %
Neutro Abs: 3275 cells/uL (ref 1500–7800)
Neutrophils Relative %: 55.5 %
Platelets: 272 10*3/uL (ref 140–400)
RBC: 4.2 10*6/uL (ref 3.80–5.10)
RDW: 13.4 % (ref 11.0–15.0)
Total Lymphocyte: 36.2 %
WBC: 5.9 10*3/uL (ref 3.8–10.8)

## 2020-09-04 LAB — LIPID PANEL
Cholesterol: 164 mg/dL (ref <200)
HDL: 55 mg/dL (ref >=50)
LDL Cholesterol (Calc): 78 mg/dL (calc)
Non-HDL Cholesterol (Calc): 109 mg/dL (calc) (ref <130)
Total CHOL/HDL Ratio: 3 (calc) (ref <5.0)
Triglycerides: 217 mg/dL — ABNORMAL HIGH (ref <150)

## 2020-09-04 LAB — HEMOGLOBIN A1C
Hgb A1c MFr Bld: 6.5 % of total Hgb — ABNORMAL HIGH (ref <5.7)
Mean Plasma Glucose: 140 mg/dL
eAG (mmol/L): 7.7 mmol/L

## 2020-09-04 LAB — MICROALBUMIN / CREATININE URINE RATIO
Creatinine, Urine: 120 mg/dL (ref 20–275)
Microalb Creat Ratio: 58 mcg/mg creat — ABNORMAL HIGH (ref <30)
Microalb, Ur: 6.9 mg/dL

## 2020-09-04 LAB — TSH: TSH: 1.49 mIU/L (ref 0.40–4.50)

## 2020-09-08 ENCOUNTER — Other Ambulatory Visit: Payer: Self-pay

## 2020-09-08 ENCOUNTER — Encounter: Payer: Self-pay | Admitting: Internal Medicine

## 2020-09-08 ENCOUNTER — Ambulatory Visit (INDEPENDENT_AMBULATORY_CARE_PROVIDER_SITE_OTHER): Payer: Medicare Other | Admitting: Internal Medicine

## 2020-09-08 VITALS — BP 128/68 | HR 60 | Ht 61.0 in | Wt 188.0 lb

## 2020-09-08 DIAGNOSIS — Z6835 Body mass index (BMI) 35.0-35.9, adult: Secondary | ICD-10-CM | POA: Diagnosis not present

## 2020-09-08 DIAGNOSIS — R3129 Other microscopic hematuria: Secondary | ICD-10-CM

## 2020-09-08 DIAGNOSIS — E1169 Type 2 diabetes mellitus with other specified complication: Secondary | ICD-10-CM

## 2020-09-08 DIAGNOSIS — E8881 Metabolic syndrome: Secondary | ICD-10-CM

## 2020-09-08 DIAGNOSIS — Z0000 Encounter for general adult medical examination without abnormal findings: Secondary | ICD-10-CM | POA: Diagnosis not present

## 2020-09-08 DIAGNOSIS — R002 Palpitations: Secondary | ICD-10-CM | POA: Diagnosis not present

## 2020-09-08 DIAGNOSIS — F411 Generalized anxiety disorder: Secondary | ICD-10-CM

## 2020-09-08 DIAGNOSIS — E785 Hyperlipidemia, unspecified: Secondary | ICD-10-CM | POA: Diagnosis not present

## 2020-09-08 DIAGNOSIS — Z23 Encounter for immunization: Secondary | ICD-10-CM

## 2020-09-08 DIAGNOSIS — Z1231 Encounter for screening mammogram for malignant neoplasm of breast: Secondary | ICD-10-CM | POA: Diagnosis not present

## 2020-09-08 DIAGNOSIS — I10 Essential (primary) hypertension: Secondary | ICD-10-CM | POA: Diagnosis not present

## 2020-09-08 LAB — POCT URINALYSIS DIPSTICK
Bilirubin, UA: NEGATIVE
Glucose, UA: NEGATIVE
Ketones, UA: NEGATIVE
Leukocytes, UA: NEGATIVE
Nitrite, UA: NEGATIVE
Protein, UA: NEGATIVE
Spec Grav, UA: 1.01 (ref 1.010–1.025)
Urobilinogen, UA: 0.2 E.U./dL
pH, UA: 6.5 (ref 5.0–8.0)

## 2020-09-08 LAB — HM MAMMOGRAPHY

## 2020-09-08 NOTE — Progress Notes (Signed)
Subjective:    Patient ID: Leah Barron, female    DOB: February 15, 1940, 80 y.o.   MRN: [id number]  HPI 80 year old Female in today for health maintenance exam Medicare wellness ,and evaluation of medical issues.  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  History of left knee osteoarthritis.  History of adenomatous colon polyps and obesity.  Remote history of microscopic hematuria previously evaluated by Urologist.  Patient had cholecystectomy 19 9080, hysterectomy without oophorectomy 1991, left breast biopsy 1978, bladder biopsies for microscopic hematuria in 1988 and 1999 both of which were benign.  In 1996 she suffered a left ankle injury secondary to a fall.  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  She is intolerant of codeine and Parafon Forte.  Takes hydrocodone APAP sparingly as needed for back pain.  Had colonoscopy in 2008.  In May 2022 she saw Dr. Ronnette Juniper at [facility] regarding diverticulosis of the colon.  Colonoscopy was done in 2019 at [facility] endoscopy.  3 adenomatous polyps were found.  Social history: She is a widow.  Husband died of complications of cirrhosis of the liver.  1 daughter with history of HIV doing well.  1 daughter with history of mental illness living.  Another daughter passed away from liver cancer in 2021.  Patient does not smoke or consume alcohol.  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  Family history: Total of 6 brothers.  1 brother with history of hypertension and kidney failure.  1 brother with history of diabetes, coronary artery disease and mental illness.  1 brother died of lung cancer.  1 sister died with diabetes mellitus, heart failure and kidney failure.  1 sister died of cardiac arrest.           Review of Systems has chronic anxiety.  Doing fairly well but still grieving the loss of her  daughter.     Objective:   Physical Exam Blood pressure 128/68 pulse 60 pulse oximetry 98% weight 198 pounds height 5 feet 1 inches BMI 35.5. Skin: Warm and dry.  No cervical adenopathy.  No thyromegaly.  No carotid bruits.  TMs are clear.  Chest is clear.  Cardiac exam: Occasional extrasystole but regular rate and rhythm.  Abdomen obese soft nondistended without hepatosplenomegaly masses or tenderness.  No lower extremity pitting edema.  Neurological exam is intact without gross focal deficits.      Assessment & Plan:  History of anxiety treated with Xanax which is longstanding  Essential hypertension  Type 2 diabetes mellitus  History of microscopic hematuria previously evaluated by urologist  GE reflux treated with PPI  History of PACs related to anxiety and stress but asymptomatic  History of lumbar disc herniation L5-S1 treated with anti-inflammatory medication and occasional hydrocodone APAP  History of adenomatous colon polyps  Obesity  Metabolic syndrome  History of grief reaction due to loss of daughter  Plan: Blood pressure stable on current regimen.  Hemoglobin A1c stable at 6.5%.  Lowest it has been is 6.1% in 2020.  Continue current medications and follow-up in 6 months.  Recommend COVID booster this fall and flu vaccine.   Subjective:   Patient presents for Medicare Annual/Subsequent preventive examination.  Review Past Medical/Family/Social: See above   Risk Factors  Current exercise habits: Mostly sedentary Dietary issues discussed: Yes discussed dietary issues and encouraged her to walk more.  Cardiac risk factors: Diabetes  Depression Screen  (Note: if answer to either of the following is "Yes", a more complete depression screening is indicated)   Over the past two weeks, have you felt down, depressed or hopeless?  At times Over the past two weeks, have you felt little interest or pleasure in doing things?  At times Have you lost interest or  pleasure in daily life? No Do you often feel hopeless? No Do you cry easily over simple problems? No   Activities of Daily Living  In your present state of health, do you have any difficulty performing the following activities?:   Driving? No  Managing money? No  Feeding yourself? No  Getting from bed to chair? No  Climbing a flight of stairs? Not if I take my time-deconditioned Preparing food and eating?: No  Bathing or showering? No  Getting dressed: No  Getting to the toilet? No  Using the toilet:No  Moving around from place to place: No  In the past year have you fallen or had a near fall?:No  Are you sexually active? No  Do you have more than one partner? No   Hearing Difficulties: No  Do you often ask people to speak up or repeat themselves? No  Do you experience ringing or noises in your ears? No  Do you have difficulty understanding soft or whispered voices? No  Do you feel that you have a problem with memory? No Do you often misplace items? No    Home Safety:  Do you have a smoke alarm at your residence? Yes Do you have grab bars in the bathroom? Do you have throw rugs in your house?   Cognitive Testing  Alert? Yes Normal Appearance?Yes  Oriented to person? Yes Place? Yes  Time? Yes  Recall of three objects? Yes  Can perform simple calculations? Yes  Displays appropriate judgment?Yes  Can read the correct time from a watch face?Yes   List the Names of Other Physician/Practitioners you currently use:  See referral list for the physicians patient is currently seeing.  [facility] for colon cancer screening   Review of Systems: See above   Objective:     General appearance: Appears stated age and obese  Head: Normocephalic, without obvious abnormality, atraumatic  Eyes: conj clear, EOMi PEERLA  Ears: normal TM's and external ear canals both ears  Nose: Nares normal. Septum midline. Mucosa normal. No drainage or sinus tenderness.  Throat: lips, mucosa,  and tongue normal; teeth and gums normal  Neck: no adenopathy, no carotid bruit, no JVD, supple, symmetrical, trachea midline and thyroid not enlarged, symmetric, no tenderness/mass/nodules  No CVA tenderness.  Lungs: clear to auscultation bilaterally  Breasts: normal appearance, no masses or tenderness Heart: regular rate and rhythm, S1, S2 normal, no murmur, click, rub or gallop  Abdomen: soft, non-tender; bowel sounds normal; no masses, no organomegaly  Musculoskeletal: ROM normal in all joints, no crepitus, no deformity, Normal muscle strengthen. Back  is symmetric, no curvature. Skin: Skin color, texture, turgor normal. No rashes or lesions  Lymph nodes: Cervical, supraclavicular, and axillary nodes normal.  Neurologic: CN 2 -12 Normal, Normal symmetric reflexes. Normal coordination and gait  Psych: Alert & Oriented x 3, Mood appear stable.    Assessment:    Annual wellness medicare exam   Plan:    During the course of the visit the patient was educated and counseled about appropriate screening and preventive services including:   Needs annual flu vaccine  Need  COVID booster in the fall     Patient Instructions (the written plan) was given to the patient.  Medicare Attestation  I have personally reviewed:  The patient's medical and social history  Their use of alcohol, tobacco or illicit drugs  Their current medications and supplements  The patient's functional ability including ADLs,fall risks, home safety risks, cognitive, and hearing and visual impairment  Diet and physical activities  Evidence for depression or mood disorders  The patient's weight, height, BMI, and visual acuity have been recorded in the chart. I have made referrals, counseling, and provided education to the patient based on review of the above and I have provided the patient with a written personalized care plan for preventive services.

## 2020-09-23 ENCOUNTER — Other Ambulatory Visit: Payer: Self-pay | Admitting: Internal Medicine

## 2020-09-28 ENCOUNTER — Encounter: Payer: Self-pay | Admitting: Internal Medicine

## 2020-09-28 DIAGNOSIS — Z86010 Personal history of colonic polyps: Secondary | ICD-10-CM | POA: Diagnosis not present

## 2020-09-28 DIAGNOSIS — D123 Benign neoplasm of transverse colon: Secondary | ICD-10-CM | POA: Diagnosis not present

## 2020-09-28 DIAGNOSIS — D120 Benign neoplasm of cecum: Secondary | ICD-10-CM | POA: Diagnosis not present

## 2020-09-28 DIAGNOSIS — K5730 Diverticulosis of large intestine without perforation or abscess without bleeding: Secondary | ICD-10-CM | POA: Diagnosis not present

## 2020-09-30 DIAGNOSIS — D120 Benign neoplasm of cecum: Secondary | ICD-10-CM | POA: Diagnosis not present

## 2020-09-30 DIAGNOSIS — D123 Benign neoplasm of transverse colon: Secondary | ICD-10-CM | POA: Diagnosis not present

## 2020-10-08 NOTE — Patient Instructions (Signed)
It was a pleasure to see you today.  Try to walk some for exercise.  It would help back pain and your physical condition.  Continue current medications.  Have COVID booster this fall as well as flu vaccine.  Return in 6 months.  No change in medications.

## 2020-11-23 ENCOUNTER — Other Ambulatory Visit: Payer: Self-pay | Admitting: Internal Medicine

## 2020-12-01 ENCOUNTER — Telehealth: Payer: Self-pay | Admitting: Internal Medicine

## 2020-12-01 ENCOUNTER — Other Ambulatory Visit: Payer: Self-pay

## 2020-12-01 DIAGNOSIS — R928 Other abnormal and inconclusive findings on diagnostic imaging of breast: Secondary | ICD-10-CM

## 2020-12-01 DIAGNOSIS — N6452 Nipple discharge: Secondary | ICD-10-CM

## 2020-12-01 NOTE — Progress Notes (Signed)
Iagnostic mammo

## 2020-12-01 NOTE — Telephone Encounter (Signed)
Leah Barron [phone]  Eddis called to say for the last few months she has been experiencing discomfort in her breast and has some discharged that she sees in her right breast. No pain. Would like to come in and see you about this.

## 2020-12-01 NOTE — Telephone Encounter (Signed)
Patien notified and was given number to call and schedule. New order for diagnostic mammo sent to [facility].

## 2020-12-22 ENCOUNTER — Other Ambulatory Visit: Payer: Self-pay | Admitting: Internal Medicine

## 2021-01-01 DIAGNOSIS — N6452 Nipple discharge: Secondary | ICD-10-CM | POA: Diagnosis not present

## 2021-01-01 DIAGNOSIS — R928 Other abnormal and inconclusive findings on diagnostic imaging of breast: Secondary | ICD-10-CM | POA: Diagnosis not present

## 2021-01-07 ENCOUNTER — Encounter: Payer: Self-pay | Admitting: Internal Medicine

## 2021-01-07 ENCOUNTER — Other Ambulatory Visit: Payer: Self-pay | Admitting: Internal Medicine

## 2021-01-13 ENCOUNTER — Other Ambulatory Visit: Payer: Self-pay | Admitting: Internal Medicine

## 2021-01-13 DIAGNOSIS — E119 Type 2 diabetes mellitus without complications: Secondary | ICD-10-CM

## 2021-02-02 ENCOUNTER — Other Ambulatory Visit: Payer: Self-pay | Admitting: Internal Medicine

## 2021-03-05 ENCOUNTER — Other Ambulatory Visit: Payer: Medicare Other | Admitting: Internal Medicine

## 2021-03-05 ENCOUNTER — Other Ambulatory Visit: Payer: Self-pay

## 2021-03-05 DIAGNOSIS — E1169 Type 2 diabetes mellitus with other specified complication: Secondary | ICD-10-CM | POA: Diagnosis not present

## 2021-03-05 DIAGNOSIS — E8881 Metabolic syndrome: Secondary | ICD-10-CM

## 2021-03-05 DIAGNOSIS — E785 Hyperlipidemia, unspecified: Secondary | ICD-10-CM | POA: Diagnosis not present

## 2021-03-06 LAB — HEPATIC FUNCTION PANEL
AG Ratio: 1.5 (calc) (ref 1.0–2.5)
ALT: 11 U/L (ref 6–29)
AST: 13 U/L (ref 10–35)
Albumin: 4.2 g/dL (ref 3.6–5.1)
Alkaline phosphatase (APISO): 69 U/L (ref 37–153)
Bilirubin, Direct: 0.1 mg/dL (ref 0.0–0.2)
Globulin: 2.8 g/dL (calc) (ref 1.9–3.7)
Indirect Bilirubin: 0.4 mg/dL (calc) (ref 0.2–1.2)
Total Bilirubin: 0.5 mg/dL (ref 0.2–1.2)
Total Protein: 7 g/dL (ref 6.1–8.1)

## 2021-03-06 LAB — LIPID PANEL
Cholesterol: 139 mg/dL (ref <200)
HDL: 55 mg/dL (ref >=50)
LDL Cholesterol (Calc): 64 mg/dL (calc)
Non-HDL Cholesterol (Calc): 84 mg/dL (calc) (ref <130)
Total CHOL/HDL Ratio: 2.5 (calc) (ref <5.0)
Triglycerides: 121 mg/dL (ref <150)

## 2021-03-06 LAB — HEMOGLOBIN A1C
Hgb A1c MFr Bld: 6.4 % of total Hgb — ABNORMAL HIGH (ref <5.7)
Mean Plasma Glucose: 137 mg/dL
eAG (mmol/L): 7.6 mmol/L

## 2021-03-08 DIAGNOSIS — H938X3 Other specified disorders of ear, bilateral: Secondary | ICD-10-CM | POA: Diagnosis not present

## 2021-03-09 ENCOUNTER — Other Ambulatory Visit: Payer: Self-pay

## 2021-03-09 ENCOUNTER — Encounter: Payer: Self-pay | Admitting: Internal Medicine

## 2021-03-09 ENCOUNTER — Ambulatory Visit (INDEPENDENT_AMBULATORY_CARE_PROVIDER_SITE_OTHER): Payer: Medicare Other | Admitting: Internal Medicine

## 2021-03-09 VITALS — BP 160/8 | HR 58 | Ht 61.0 in | Wt 183.5 lb

## 2021-03-09 DIAGNOSIS — E1169 Type 2 diabetes mellitus with other specified complication: Secondary | ICD-10-CM

## 2021-03-09 DIAGNOSIS — F411 Generalized anxiety disorder: Secondary | ICD-10-CM

## 2021-03-09 DIAGNOSIS — I10 Essential (primary) hypertension: Secondary | ICD-10-CM

## 2021-03-09 DIAGNOSIS — E785 Hyperlipidemia, unspecified: Secondary | ICD-10-CM | POA: Diagnosis not present

## 2021-03-09 DIAGNOSIS — Z6834 Body mass index (BMI) 34.0-34.9, adult: Secondary | ICD-10-CM

## 2021-03-09 NOTE — Progress Notes (Unsigned)
° °  Subjective:    Patient ID: Leah Barron, female    DOB: 06/26/1940, 81 y.o.   MRN: [id number]  HPI 81 year old Female    Review of Systems     Objective:   Physical Exam        Assessment & Plan:

## 2021-03-09 NOTE — Patient Instructions (Signed)
Please have someone drive you to office in 6 weeks. BP is elevated today due to driving on [address].

## 2021-04-08 ENCOUNTER — Other Ambulatory Visit: Payer: Self-pay | Admitting: Internal Medicine

## 2021-04-08 DIAGNOSIS — E119 Type 2 diabetes mellitus without complications: Secondary | ICD-10-CM

## 2021-04-20 ENCOUNTER — Ambulatory Visit: Payer: Medicare Other | Admitting: Internal Medicine

## 2021-05-11 DIAGNOSIS — H04123 Dry eye syndrome of bilateral lacrimal glands: Secondary | ICD-10-CM | POA: Diagnosis not present

## 2021-05-11 DIAGNOSIS — H52223 Regular astigmatism, bilateral: Secondary | ICD-10-CM | POA: Diagnosis not present

## 2021-05-11 DIAGNOSIS — Z9841 Cataract extraction status, right eye: Secondary | ICD-10-CM | POA: Diagnosis not present

## 2021-05-11 DIAGNOSIS — H40013 Open angle with borderline findings, low risk, bilateral: Secondary | ICD-10-CM | POA: Diagnosis not present

## 2021-05-11 DIAGNOSIS — Z9842 Cataract extraction status, left eye: Secondary | ICD-10-CM | POA: Diagnosis not present

## 2021-05-11 DIAGNOSIS — E119 Type 2 diabetes mellitus without complications: Secondary | ICD-10-CM | POA: Diagnosis not present

## 2021-05-11 LAB — HM DIABETES EYE EXAM

## 2021-05-14 ENCOUNTER — Encounter: Payer: Self-pay | Admitting: Internal Medicine

## 2021-05-14 ENCOUNTER — Ambulatory Visit (INDEPENDENT_AMBULATORY_CARE_PROVIDER_SITE_OTHER): Payer: Medicare Other | Admitting: Internal Medicine

## 2021-05-14 VITALS — BP 168/72 | HR 59 | Temp 97.5°F | Wt 182.5 lb

## 2021-05-14 DIAGNOSIS — F439 Reaction to severe stress, unspecified: Secondary | ICD-10-CM | POA: Diagnosis not present

## 2021-05-14 DIAGNOSIS — R600 Localized edema: Secondary | ICD-10-CM | POA: Diagnosis not present

## 2021-05-14 DIAGNOSIS — I10 Essential (primary) hypertension: Secondary | ICD-10-CM | POA: Diagnosis not present

## 2021-05-14 DIAGNOSIS — E1169 Type 2 diabetes mellitus with other specified complication: Secondary | ICD-10-CM | POA: Diagnosis not present

## 2021-05-14 DIAGNOSIS — E785 Hyperlipidemia, unspecified: Secondary | ICD-10-CM

## 2021-05-14 DIAGNOSIS — Z6834 Body mass index (BMI) 34.0-34.9, adult: Secondary | ICD-10-CM

## 2021-05-14 MED ORDER — FUROSEMIDE 20 MG PO TABS: 20.0000 mg | ORAL_TABLET | Freq: Every day | ORAL | 0 refills | Status: DC

## 2021-05-14 NOTE — Progress Notes (Signed)
   Subjective:    Patient ID: Leah Barron, female    DOB: September 13, 1940, 81 y.o.   MRN: [id number]  HPI 81 year old Female here for follow up regarding blood pressure, anxiety and situational stress.  Driving to my office makes her anxious.  Had advised her to take Xanax prior to driving here but sometimes she forgets.  Situational stress with family.    Review of Systems has new BP machine. Feet have been swelling since this past Sunday. Flew home from [place] about 10 days ago. Looking after relative at home     Objective:   Physical Exam Blood pressure today is 168/72 pulse 59 BMI 34.48 weight 182 pounds 8 ounces her chest is clear.  Cardiac exam regular rate and rhythm without ectopy.  She has some pitting edema of her lower extremities.  Labs were not checked today.  In March hemoglobin A1c was 6.4% and lipid panel was normal as well as liver functions.       Assessment & Plan:  Labile hypertension-cannot seem to get blood pressure under good control but I think anxiety is playing a role.  She will take Benicar 20 mg daily, metoprolol 25 mg daily and Lasix 20 mg daily.  Situational stress-brother is in poor health  Dependent edema to be treated with low-dose Lasix instead of HCTZ.  Type 2 diabetes mellitus with hemoglobin A1c in March of 6.4%  Plan: Patient will take Lasix 20 mg daily for dependent edema.  Continue with Xanax 0.5 mg twice daily for anxiety and situational stress.  Continue with Lipitor 20 mg daily and Benicar 20 mg daily.  Is on metformin for glucose intolerance.  Remains on metoprolol 25 mg daily.  Also previously was on HCTZ but will not take that while she is taking Lasix.  She has some mild chronic kidney disease.  Creatinine needs to be checked at next visit.  She agrees to come in September for health maintenance exam and Medicare wellness visit.  Monitor blood pressure at home.

## 2021-05-24 ENCOUNTER — Ambulatory Visit: Payer: Medicare Other | Admitting: Internal Medicine

## 2021-05-27 ENCOUNTER — Other Ambulatory Visit: Payer: Self-pay | Admitting: Internal Medicine

## 2021-06-02 NOTE — Patient Instructions (Addendum)
Take Lasix 20 mg daily and discontinue HCTZ.  Follow-up here in September for health maintenance exam and Medicare wellness visit.  Call sooner if problems arise.  Monitor blood pressure at home.

## 2021-06-17 ENCOUNTER — Other Ambulatory Visit: Payer: Self-pay | Admitting: Internal Medicine

## 2021-06-18 ENCOUNTER — Other Ambulatory Visit: Payer: Self-pay | Admitting: Internal Medicine

## 2021-06-20 ENCOUNTER — Other Ambulatory Visit: Payer: Self-pay | Admitting: Internal Medicine

## 2021-06-21 ENCOUNTER — Ambulatory Visit (INDEPENDENT_AMBULATORY_CARE_PROVIDER_SITE_OTHER): Payer: Medicare Other | Admitting: Internal Medicine

## 2021-06-21 ENCOUNTER — Encounter: Payer: Self-pay | Admitting: Internal Medicine

## 2021-06-21 VITALS — BP 148/70 | HR 60 | Temp 97.8°F | Ht 61.0 in | Wt 185.0 lb

## 2021-06-21 DIAGNOSIS — E785 Hyperlipidemia, unspecified: Secondary | ICD-10-CM

## 2021-06-21 DIAGNOSIS — F439 Reaction to severe stress, unspecified: Secondary | ICD-10-CM

## 2021-06-21 DIAGNOSIS — F4321 Adjustment disorder with depressed mood: Secondary | ICD-10-CM

## 2021-06-21 DIAGNOSIS — F411 Generalized anxiety disorder: Secondary | ICD-10-CM | POA: Diagnosis not present

## 2021-06-21 DIAGNOSIS — I10 Essential (primary) hypertension: Secondary | ICD-10-CM | POA: Diagnosis not present

## 2021-06-21 DIAGNOSIS — E1169 Type 2 diabetes mellitus with other specified complication: Secondary | ICD-10-CM | POA: Diagnosis not present

## 2021-06-21 MED ORDER — OLMESARTAN MEDOXOMIL 40 MG PO TABS: 40.0000 mg | ORAL_TABLET | Freq: Every day | ORAL | 1 refills | Status: DC

## 2021-06-21 NOTE — Progress Notes (Signed)
BP check  

## 2021-06-21 NOTE — Patient Instructions (Signed)
We are sorry to hear about the loss of 2 family members.  There has been a lot of change in adjustment in your life in the past few weeks.  Please continue to monitor your blood pressure at home.  Basic metabolic panel today to check electrolytes and kidney functions drawn and pending.  We will call you with result.  Increase Benicar to 40 mg daily and follow-up here in 4 weeks.

## 2021-06-21 NOTE — Progress Notes (Signed)
   Subjective:    Patient ID: Leah Barron, female    DOB: 1940-11-16, 81 y.o.   MRN: [id number]  HPI 81 year old Female here for follow up on HTN.  Considerable situational stress.  Her son in law who was residing with her passed away from complications of lymphoma.  She also lost her brother in [place] recently.  She is now moved in with her daughter, Leah Barron and her husband.  Considerable situational stress with family members and going through 2 funerals recently.  She has adjusted very well.  She has a good attitude.  She was here May 12 for follow-up regarding anxiety and hypertension as well as stress.  Was advised at that time to take Benicar 20 mg daily, metoprolol 25 mg daily and Lasix 20 mg daily.  She says she has been doing this.  She has home blood pressure monitor and says that blood pressure sometimes remains elevated.   Review of Systems denies headache, insomnia, chest pain, nausea, vomiting     Objective:   Physical Exam  Blood pressure today is 148/70 pulse 68 temperature 97.8 degrees BMI 34.96 Affect is slightly anxious.  Chest is clear to auscultation.  No carotid bruits.  Cardiac exam: Regular rate and rhythm without ectopy.  No significant pitting edema of the lower extremities.     Assessment & Plan:  Situational stress, grief, anxiety-she has Xanax on hand to take  Essential hypertension recently not well controlled.  Has been on Benicar 20 mg daily, metoprolol 25 mg daily and Lasix 20 mg daily.  Basic metabolic panel drawn and pending today.  History of hyperlipidemia treated with Lipitor 20 mg daily and lipid panel in March was normal  Type 2 diabetes mellitus-hemoglobin A1c in March was 6.4%  Grief reaction  Plan: Increase Benicar to 40 mg daily and follow-up in 4 weeks.  Continue to monitor blood pressure at home.  Call if persistently elevated despite new medication.

## 2021-06-22 LAB — BASIC METABOLIC PANEL
BUN/Creatinine Ratio: 24 (calc) — ABNORMAL HIGH (ref 6–22)
BUN: 23 mg/dL (ref 7–25)
CO2: 28 mmol/L (ref 20–32)
Calcium: 9.5 mg/dL (ref 8.6–10.4)
Chloride: 106 mmol/L (ref 98–110)
Creat: 0.97 mg/dL — ABNORMAL HIGH (ref 0.60–0.95)
Glucose, Bld: 144 mg/dL — ABNORMAL HIGH (ref 65–99)
Potassium: 4.5 mmol/L (ref 3.5–5.3)
Sodium: 142 mmol/L (ref 135–146)

## 2021-07-05 ENCOUNTER — Ambulatory Visit
Admission: EM | Admit: 2021-07-05 | Discharge: 2021-07-05 | Disposition: A | Discharge status: Home / Self Care | Payer: Medicare Other | Attending: Internal Medicine | Admitting: Internal Medicine

## 2021-07-05 DIAGNOSIS — L03311 Cellulitis of abdominal wall: Secondary | ICD-10-CM

## 2021-07-05 DIAGNOSIS — R0789 Other chest pain: Secondary | ICD-10-CM | POA: Diagnosis not present

## 2021-07-05 MED ORDER — CEPHALEXIN 500 MG PO CAPS: 500.0000 mg | ORAL_CAPSULE | Freq: Three times a day (TID) | ORAL | 0 refills | Status: AC

## 2021-07-05 MED ORDER — DICLOFENAC SODIUM 1 % EX GEL: 2.0000 g | Freq: Four times a day (QID) | CUTANEOUS | 0 refills | Status: DC

## 2021-07-05 NOTE — Discharge Instructions (Addendum)
Please take medications as prescribed Gentle range of motion exercises of both shoulders If symptoms persist or worsens please return to urgent care to be reevaluated

## 2021-07-05 NOTE — ED Triage Notes (Signed)
Patient presents to Urgent Care with complaints of rash on right hip since last week. Patient reports she did get vaccinated for shingles, and it itches badly. Pt also reports pain and redness at old scar site from surgery on L chest.

## 2021-07-07 NOTE — ED Provider Notes (Signed)
[facility]    CSN: [id number] Arrival date & time: 07/05/21  1240      History   Chief Complaint Chief Complaint  Patient presents with   Rash    HPI Leah Barron is a 81 y.o. female was to urgent care with 1 week history of painful rash on the right hip.  Patient says the rash started after she received his shingles vaccination.  Rash is painful, red and tender to touch.  No fever or chills.  No discharge.  No trauma or falls.  No sick contacts.  Patient also complains of pain and some redness on the left upper chest.  Patient had surgery on the left upper chest in the past.  No postsurgical complications. 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   GERD (gastroesophageal reflux disease) 05/11/2010   Type 2 diabetes mellitus ([place]) 05/11/2010   Hyperlipidemia 05/11/2010   Vitamin D deficiency 05/11/2010   Hx of adenomatous colonic polyps 05/11/2010   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      Home Medications    Prior to Admission medications   Medication Sig Start Date End Date Taking?  Authorizing Provider  cephALEXin (KEFLEX) 500 MG capsule Take 1 capsule (500 mg total) by mouth 3 (three) times daily for 7 days. 07/05/21 07/12/21 Yes Higinio Grow, Myrene Galas, MD  diclofenac Sodium (VOLTAREN) 1 % GEL Apply 2 g topically 4 (four) times daily. 07/05/21  Yes Charliegh Vasudevan, Myrene Galas, MD  ACCU-CHEK GUIDE test strip TEST ONCE DAILY 02/02/21   Elby Showers, MD  Accu-Chek Softclix Lancets lancets TEST ONCE DAILY 01/14/21   Elby Showers, MD  ALPRAZolam Duanne Moron) 0.5 MG tablet TAKE 1 TABLET BY MOUTH TWICE A DAY 05/27/21   Elby Showers, MD  aspirin 81 MG tablet Take 81 mg by mouth daily.    [provider]  atorvastatin (LIPITOR) 20 MG tablet TAKE 1 TABLET BY MOUTH  DAILY 12/22/20   Elby Showers, MD  Blood Glucose Calibration (ACCU-CHEK AVIVA) SOLN USE AS DIRECTED 03/22/16   Elby Showers, MD  Blood Glucose Monitoring Suppl (ACCU-CHEK GUIDE ME) w/Device KIT CHECK BLOOD SUGAR ONCE  DAILY 04/08/21   Elby Showers, MD  Cholecalciferol 2000 UNITS CAPS Take 2,000 Units by mouth 1 day or 1 dose.    [provider]  furosemide (LASIX) 20 MG tablet Take 1 tablet (20 mg total) by mouth daily. 05/14/21   Elby Showers, MD  hydrochlorothiazide (HYDRODIURIL)  25 MG tablet TAKE 1 TABLET BY MOUTH  DAILY 06/17/21   Elby Showers, MD  ibuprofen (ADVIL) 800 MG tablet TAKE 1 TABLET BY MOUTH THREE TIMES A DAY 06/23/19   Elby Showers, MD  Lancet Devices (AUTOLET IMPRESSION) MISC As directed 12/04/12   Elby Showers, MD  Lancets Misc. (ACCU-CHEK SOFTCLIX LANCET DEV) KIT USE ONCE DAILY FOR GLUCOSE  TESTING 01/12/17   Elby Showers, MD  metFORMIN (GLUCOPHAGE-XR) 500 MG 24 hr tablet TAKE 1 TABLET BY MOUTH  TWICE DAILY WITH MEALS 06/20/21   Elby Showers, MD  metoprolol tartrate (LOPRESSOR) 25 MG tablet TAKE 1 TABLET BY MOUTH  DAILY 06/17/21   Elby Showers, MD  Multiple Vitamin (MULTIVITAMIN) tablet Take 1 tablet by mouth daily.    [provider]  olmesartan (BENICAR) 40 MG tablet Take 1 tablet (40 mg  total) by mouth daily. 06/21/21   Elby Showers, MD  Omega-3 Fatty Acids (FISH OIL) 1000 MG CAPS Take by mouth.    [provider]  omeprazole (PRILOSEC) 40 MG capsule TAKE 1 CAPSULE BY MOUTH  DAILY 06/17/21   Elby Showers, MD  rOPINIRole (REQUIP) 0.5 MG tablet TAKE 1 TABLET BY MOUTH AT  BEDTIME 06/20/21   Baxley, Cresenciano Lick, MD    Family History Family History  Problem Relation Age of Onset   Dementia Mother    Diabetes Father    Diabetes Sister    Heart disease Sister    Diabetes Brother    Kidney disease Brother    HIV Daughter     Social History Social History   Tobacco Use   Smoking status: Never   Smokeless tobacco: Never  Substance Use Topics   Alcohol use: No   Drug use: No     Allergies   Chlorzoxazone and Codeine   Review of Systems Review of Systems  Cardiovascular: Negative.   Gastrointestinal: Negative.   Musculoskeletal: Negative.   Skin:  Positive for color change and rash.  Neurological: Negative.      Physical Exam Triage Vital Signs ED Triage Vitals [07/05/21 1410]  Enc Vitals Group     BP (!) 188/74     Pulse Rate 62     Resp 20     Temp 98 F (36.7 C)     Temp src      SpO2 98 %     Weight      Height      Head Circumference      Peak Flow      Pain Score      Pain Loc      Pain Edu?      Excl. in [place]?    No data found.  Updated Vital Signs BP (!) 188/74   Pulse 62   Temp 98 F (36.7 C)   Resp 20   SpO2 98%   Visual Acuity Right Eye Distance:   Left Eye Distance:   Bilateral Distance:    Right Eye Near:   Left Eye Near:    Bilateral Near:     Physical Exam Vitals and nursing note reviewed.  Constitutional:      General: She is not in acute distress.    Appearance: She is not ill-appearing.  Cardiovascular:     Rate and Rhythm: Normal rate and regular rhythm.  Musculoskeletal:        General: Normal range of motion.     Comments: No significant erythema, fluctuance or tenderness  on the left upper chest  wall.  Skin:    General: Skin is warm.     Comments: Erythema on the right flank/right anterior abdominal wall.  No fluctuance.  No rash noted.  Neurological:     Mental Status: She is alert.      UC Treatments / Results  Labs (all labs ordered are listed, but only abnormal results are displayed) Labs Reviewed - No data to display  EKG   Radiology No results found.  Procedures Procedures (including critical care time)  Medications Ordered in UC Medications - No data to display  Initial Impression / Assessment and Plan / UC Course  I have reviewed the triage vital signs and the nursing notes.  Pertinent labs & imaging results that were available during my care of the patient were reviewed by me and considered in my medical decision making (see chart for details).     1.  Cellulitis of the abdominal wall: Keflex 500 mg 3 times daily for 7 days Maintain adequate hydration Tylenol/Motrin as needed for pain and/or fever If erythema continues to spread or if you notice purulent drainage please return to urgent care to be reevaluated  2.  Anterior chest wall pain: Diclofenac gel as needed Return precautions given. Final Clinical Impressions(s) / UC Diagnoses   Final diagnoses:  Cellulitis, abdominal wall  Chest wall pain     Discharge Instructions      Please take medications as prescribed Gentle range of motion exercises of both shoulders If symptoms persist or worsens please return to urgent care to be reevaluated    ED Prescriptions     Medication Sig Dispense Auth. Provider   diclofenac Sodium (VOLTAREN) 1 % GEL Apply 2 g topically 4 (four) times daily. 100 g Emilina Smarr, Myrene Galas, MD   cephALEXin (KEFLEX) 500 MG capsule Take 1 capsule (500 mg total) by mouth 3 (three) times daily for 7 days. 20 capsule Nakyia Dau, Myrene Galas, MD      PDMP not reviewed this encounter.   Chase Picket, MD 07/07/21 [phone]

## 2021-07-08 ENCOUNTER — Telehealth: Payer: Self-pay | Admitting: Internal Medicine

## 2021-07-08 NOTE — Telephone Encounter (Signed)
Leah Barron [phone]  Jil LVM that she may have possible shingles on 07/05/2021.  I called her back on 07/08/2021 when we returned back in office and she had gone to Urgent Care and was treated. She said she did not have shingles, she was given an antibiotic and will call us back if she does not get better.

## 2021-07-15 ENCOUNTER — Other Ambulatory Visit: Payer: Self-pay | Admitting: Internal Medicine

## 2021-08-02 ENCOUNTER — Encounter: Payer: Self-pay | Admitting: Internal Medicine

## 2021-08-02 ENCOUNTER — Ambulatory Visit (INDEPENDENT_AMBULATORY_CARE_PROVIDER_SITE_OTHER): Payer: Medicare Other | Admitting: Internal Medicine

## 2021-08-02 VITALS — BP 160/70 | HR 65 | Temp 97.8°F | Wt 182.2 lb

## 2021-08-02 DIAGNOSIS — E785 Hyperlipidemia, unspecified: Secondary | ICD-10-CM

## 2021-08-02 DIAGNOSIS — F411 Generalized anxiety disorder: Secondary | ICD-10-CM | POA: Diagnosis not present

## 2021-08-02 DIAGNOSIS — Z6834 Body mass index (BMI) 34.0-34.9, adult: Secondary | ICD-10-CM | POA: Diagnosis not present

## 2021-08-02 DIAGNOSIS — E1169 Type 2 diabetes mellitus with other specified complication: Secondary | ICD-10-CM

## 2021-08-02 DIAGNOSIS — I10 Essential (primary) hypertension: Secondary | ICD-10-CM

## 2021-08-02 MED ORDER — AMLODIPINE BESYLATE 5 MG PO TABS: 5.0000 mg | ORAL_TABLET | Freq: Every day | ORAL | 0 refills | Status: DC

## 2021-08-02 NOTE — Patient Instructions (Addendum)
Have added amlodipine to her current blood pressure regimen.  Return in 4 weeks.  Will need basic metabolic panel at that time.  If blood pressure is not coming under control, she will need to be referred to Cardiology for management of her hypertension.

## 2021-08-02 NOTE — Progress Notes (Signed)
   Subjective:    Patient ID: Leah Barron, female    DOB: 1940-07-26, 81 y.o.   MRN: [id number]  HPI 81 year old Female in recheck on BP. Currently on Olmesartan 40 mg daily, Lasix 20 mg daily, metoprolol 25 mg daily.  Blood pressure remains elevated at  426 to 834 systolically.  Diastolics range today from 70-90.    Review of Systems     Objective:   Physical Exam BP 160/70 rechecked 180/90       Assessment & Plan:

## 2021-08-02 NOTE — Progress Notes (Signed)
   Subjective:    Patient ID: ERCILIA BETTINGER, female    DOB: [birth year], 81 y.o.   MRN: [id number]  HPI 81 year old Female seen for follow-up on hypertension.  Has had considerable situational stress with deaths in the family.  She also is now staying with her daughter [name] and her husband. Ron Parker, resides with them.  He is an adult Female with Tourette's syndrome.  He may be the source of some of her stress and hypertension issues.  He has mood swings but is not violent. He was upset with her this morning. She says she is compliant with her antihypertensive regimen.  Grief seems to be improving slowly.  She currently is on Xanax twice daily.  She is also on Benicar 40 mg daily, metoprolol 25 mg daily and Lasix 20 mg daily. Hemoglobin A1c in March was 6.4% and stable.  Lipid panel was normal in March.  BUN and creatinine in June were normal.  Review of Systems complains of dark school for several days.     Objective:   Physical Exam Blood pressure elevated at 160/70.  I rechecked it personally and actually got 735 systolically.  Her weight is 182 pounds 4 ounces and BMI is 34.44.  Skin warm and dry.  Chest clear.  Cardiac exam: Regular rate and rhythm without ectopy or murmur.  There is trace lower extremity pitting edema.       Assessment & Plan:  Elevated systolic blood pressure. Blood pressure is still not under good control.  I do not have home blood pressure readings from her.  I am going to add amlodipine 5 mg daily and we will follow-up in 4 weeks.  She will need basic metabolic panel at that time.  If blood pressures not coming under good control we will seek cardiology consultation at that time.  Anxiety seems much better today.  She is calmer.

## 2021-08-05 ENCOUNTER — Other Ambulatory Visit: Payer: Self-pay | Admitting: Internal Medicine

## 2021-08-10 ENCOUNTER — Other Ambulatory Visit: Payer: Self-pay

## 2021-08-10 ENCOUNTER — Telehealth: Payer: Self-pay

## 2021-08-10 NOTE — Telephone Encounter (Signed)
Patient returned 3 Hemoccult stool cards and all 3 were guaiac negative

## 2021-08-20 ENCOUNTER — Other Ambulatory Visit: Payer: Self-pay | Admitting: Internal Medicine

## 2021-08-22 ENCOUNTER — Other Ambulatory Visit: Payer: Self-pay | Admitting: Internal Medicine

## 2021-09-02 ENCOUNTER — Other Ambulatory Visit: Payer: Medicare Other

## 2021-09-02 ENCOUNTER — Encounter: Payer: Self-pay | Admitting: Internal Medicine

## 2021-09-02 ENCOUNTER — Ambulatory Visit (INDEPENDENT_AMBULATORY_CARE_PROVIDER_SITE_OTHER): Payer: Medicare Other | Admitting: Internal Medicine

## 2021-09-02 VITALS — BP 140/80 | HR 58 | Temp 98.5°F | Ht 61.0 in | Wt 182.1 lb

## 2021-09-02 DIAGNOSIS — I10 Essential (primary) hypertension: Secondary | ICD-10-CM

## 2021-09-02 DIAGNOSIS — F439 Reaction to severe stress, unspecified: Secondary | ICD-10-CM

## 2021-09-02 DIAGNOSIS — R5383 Other fatigue: Secondary | ICD-10-CM

## 2021-09-02 DIAGNOSIS — Z8659 Personal history of other mental and behavioral disorders: Secondary | ICD-10-CM | POA: Diagnosis not present

## 2021-09-02 DIAGNOSIS — E785 Hyperlipidemia, unspecified: Secondary | ICD-10-CM | POA: Diagnosis not present

## 2021-09-02 DIAGNOSIS — E1169 Type 2 diabetes mellitus with other specified complication: Secondary | ICD-10-CM | POA: Diagnosis not present

## 2021-09-02 DIAGNOSIS — Z23 Encounter for immunization: Secondary | ICD-10-CM

## 2021-09-02 DIAGNOSIS — E8881 Metabolic syndrome: Secondary | ICD-10-CM

## 2021-09-02 NOTE — Addendum Note (Signed)
Addended by: Geradine Girt D on: 09/02/2021 10:19 AM   Modules accepted: Orders

## 2021-09-02 NOTE — Progress Notes (Signed)
b

## 2021-09-02 NOTE — Patient Instructions (Signed)
Continue multidrug regimen for hypertension control.  Flu vaccine given today.  Has annual Medicare wellness and health maintenance exam next week.  Fasting labs drawn today.

## 2021-09-02 NOTE — Progress Notes (Deleted)
   Subjective:    Patient ID: Leah Barron, female    DOB: Sep 15, 1940, 81 y.o.   MRN: [id number]  HPI    Review of Systems     Objective:   Physical Exam        Assessment & Plan:

## 2021-09-02 NOTE — Progress Notes (Signed)
   Subjective:    Patient ID: Leah Barron, female    DOB: [birth year], 81 y.o.   MRN: [id number]  HPI  Here for BP check. Now on multidrug regimen consisting of amlodipine 5 mg daily, furosemide 20 mg daily, metoprolol 25 mg daily and olmesartan 40 mg daily.  She is much less anxious in the office today.  She has her annual Medicare wellness visit and health maintenance exam next week and she will be get fasting labs for that exam today.  Says blood pressure at home this morning was actually in the high 893Y systolically.  Feeling calmer.  Walking the dog for exercise.    Review of Systems no new complaints     Objective:   Physical Exam Blood pressure 140/80 pulse 58 regular temperature 98.5 degrees pulse oximetry 99% weight 182 pounds 1.9 ounces BMI 34.41 Chest clear.  Cardiac exam: Regular rate and rhythm without ectopy.       Assessment & Plan:  Essential hypertension-currently stable on multidrug regimen  Labile hypertension varies with situational stress and anxiety  Anxiety-takes Xanax  Need for flu vaccine-given  Health maintenance today she will have some of her needed vaccines at pharmacy in the near future.  We did give her flu vaccine today.

## 2021-09-07 ENCOUNTER — Other Ambulatory Visit: Payer: Self-pay

## 2021-09-08 LAB — COMPLETE METABOLIC PANEL WITH GFR
AG Ratio: 1.6 (calc) (ref 1.0–2.5)
ALT: 20 U/L (ref 6–29)
AST: 17 U/L (ref 10–35)
Albumin: 4.2 g/dL (ref 3.6–5.1)
Alkaline phosphatase (APISO): 77 U/L (ref 37–153)
BUN: 17 mg/dL (ref 7–25)
CO2: 30 mmol/L (ref 20–32)
Calcium: 10 mg/dL (ref 8.6–10.4)
Chloride: 105 mmol/L (ref 98–110)
Creat: 0.92 mg/dL (ref 0.60–0.95)
Globulin: 2.7 g/dL (calc) (ref 1.9–3.7)
Glucose, Bld: 138 mg/dL — ABNORMAL HIGH (ref 65–99)
Potassium: 4 mmol/L (ref 3.5–5.3)
Sodium: 142 mmol/L (ref 135–146)
Total Bilirubin: 0.6 mg/dL (ref 0.2–1.2)
Total Protein: 6.9 g/dL (ref 6.1–8.1)
eGFR: 63 mL/min/{1.73_m2} (ref >=60)

## 2021-09-08 LAB — CBC WITH DIFFERENTIAL/PLATELET
Absolute Monocytes: 400 cells/uL (ref 200–950)
Basophils Absolute: 10 cells/uL (ref 0–200)
Basophils Relative: 0.2 %
Eosinophils Absolute: 60 cells/uL (ref 15–500)
Eosinophils Relative: 1.2 %
HCT: 36.2 % (ref 35.0–45.0)
Hemoglobin: 12.4 g/dL (ref 11.7–15.5)
Lymphs Abs: 1765 cells/uL (ref 850–3900)
MCH: 31.5 pg (ref 27.0–33.0)
MCHC: 34.3 g/dL (ref 32.0–36.0)
MCV: 91.9 fL (ref 80.0–100.0)
MPV: 9.4 fL (ref 7.5–12.5)
Monocytes Relative: 8 %
Neutro Abs: 2765 cells/uL (ref 1500–7800)
Neutrophils Relative %: 55.3 %
Platelets: 241 10*3/uL (ref 140–400)
RBC: 3.94 10*6/uL (ref 3.80–5.10)
RDW: 12.7 % (ref 11.0–15.0)
Total Lymphocyte: 35.3 %
WBC: 5 10*3/uL (ref 3.8–10.8)

## 2021-09-08 LAB — LIPID PANEL
Cholesterol: 142 mg/dL (ref <200)
HDL: 52 mg/dL (ref >=50)
LDL Cholesterol (Calc): 64 mg/dL (calc)
Non-HDL Cholesterol (Calc): 90 mg/dL (calc) (ref <130)
Total CHOL/HDL Ratio: 2.7 (calc) (ref <5.0)
Triglycerides: 179 mg/dL — ABNORMAL HIGH (ref <150)

## 2021-09-08 LAB — TSH: TSH: 1.01 mIU/L (ref 0.40–4.50)

## 2021-09-08 LAB — HEMOGLOBIN A1C W/OUT EAG: Hgb A1c MFr Bld: 6.1 % of total Hgb — ABNORMAL HIGH (ref <5.7)

## 2021-09-09 ENCOUNTER — Encounter: Payer: Self-pay | Admitting: Internal Medicine

## 2021-09-09 ENCOUNTER — Other Ambulatory Visit: Payer: Self-pay | Admitting: Internal Medicine

## 2021-09-09 ENCOUNTER — Ambulatory Visit (INDEPENDENT_AMBULATORY_CARE_PROVIDER_SITE_OTHER): Payer: Medicare Other | Admitting: Internal Medicine

## 2021-09-09 VITALS — BP 130/70 | HR 56 | Temp 97.8°F | Ht 60.5 in | Wt 181.1 lb

## 2021-09-09 DIAGNOSIS — E8881 Metabolic syndrome: Secondary | ICD-10-CM

## 2021-09-09 DIAGNOSIS — Z6834 Body mass index (BMI) 34.0-34.9, adult: Secondary | ICD-10-CM

## 2021-09-09 DIAGNOSIS — Z23 Encounter for immunization: Secondary | ICD-10-CM

## 2021-09-09 DIAGNOSIS — Z8659 Personal history of other mental and behavioral disorders: Secondary | ICD-10-CM

## 2021-09-09 DIAGNOSIS — E1169 Type 2 diabetes mellitus with other specified complication: Secondary | ICD-10-CM

## 2021-09-09 DIAGNOSIS — I10 Essential (primary) hypertension: Secondary | ICD-10-CM | POA: Diagnosis not present

## 2021-09-09 DIAGNOSIS — I491 Atrial premature depolarization: Secondary | ICD-10-CM

## 2021-09-09 DIAGNOSIS — Z0000 Encounter for general adult medical examination without abnormal findings: Secondary | ICD-10-CM

## 2021-09-09 DIAGNOSIS — R82998 Other abnormal findings in urine: Secondary | ICD-10-CM

## 2021-09-09 DIAGNOSIS — E785 Hyperlipidemia, unspecified: Secondary | ICD-10-CM

## 2021-09-09 DIAGNOSIS — F439 Reaction to severe stress, unspecified: Secondary | ICD-10-CM

## 2021-09-09 DIAGNOSIS — R3129 Other microscopic hematuria: Secondary | ICD-10-CM

## 2021-09-09 DIAGNOSIS — F411 Generalized anxiety disorder: Secondary | ICD-10-CM

## 2021-09-09 LAB — POCT URINALYSIS DIPSTICK
Bilirubin, UA: NEGATIVE
Glucose, UA: NEGATIVE
Ketones, UA: NEGATIVE
Nitrite, UA: NEGATIVE
Protein, UA: NEGATIVE
Spec Grav, UA: 1.01 (ref 1.010–1.025)
Urobilinogen, UA: 0.2 E.U./dL
pH, UA: 7 (ref 5.0–8.0)

## 2021-09-09 NOTE — Progress Notes (Deleted)
IElby Showers, MD, have reviewed all documentation for this visit. The documentation on 09/09/21 for the exam, diagnosis, procedures, and orders are all accurate and complete.

## 2021-09-09 NOTE — Patient Instructions (Addendum)
Immunizations discussed.  Labs reviewed.  Her blood pressure is improved.  Her lipids are stable.  Her hemoglobin A1c is stable.  Return in 6 months or as needed.  Flu vaccine given.

## 2021-09-09 NOTE — Progress Notes (Signed)
Annual Wellness Visit     Patient: Leah Barron, Female    DOB: 10/01/1940, 81 y.o.   MRN: [id number] Visit Date: 09/09/2021  Chief Complaint  Patient presents with   Medicare Wellness   Subjective    Leah Barron is a 81 y.o. Female who presents today for her Annual Wellness Visit.  HPI Also here for health maintenance exam and evaluation of medical issues. 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  History of left knee osteoarthritis.  History of adenomatous colon polyps and obesity.  Remote history of microscopic hematuria previously evaluated by urologist.  Patient has had cholecystectomy in 1998, hysterectomy without oophorectomy 1991, left breast biopsy 1978, bladder biopsies for microscopic hematuria in [place] both of which were benign.  In 1996 she suffered a left ankle injury secondary to a fall.  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  She is intolerant of codeine and Parafon forte.  Takes hydrocodone APAP sparingly as needed for back pain.  Had colonoscopy in 2008.  Social history: She is a widow.  Husband died from complications of cirrhosis of the liver a number of years ago.  1 daughter living with history of HIV doing well.  1 daughter with history of mental illness.  Patient does not smoke or consume alcohol.  She has a grandson.  She is a Programme researcher, broadcasting/film/video.  Lives with daughter, grandson Leah Barron, and son-in-law who is a Company secretary currently.  Family history: Large family: 6 brothers total.  1 brother with history of hypertension and kidney failure.  1 brother with history of diabetes, coronary artery disease and mental illness.  1 brother died of lung cancer.  1 sister died with diabetes mellitus, heart failure and kidney failure.  1 sister died of cardiac arrest.  1 daughter, Leah Barron died of cancer during the  pandemic.       Review of Systems continues with situational stress and anxiety.  Gets anxious driving in heavy traffic.  Some times blood pressure is elevated when she arrives here after driving.  We have adjusted her blood pressure medication recently.   Objective    Vitals: BP 132/76   Pulse (!) 56   Temp 97.8 F (36.6 C) (Tympanic)   Ht 5' 0.5" (1.537 m)   Wt 181 lb 1.9 oz (82.2 kg)   SpO2 99%   BMI 34.79 kg/m   Physical Exam  Skin: Warm and dry.  Nodes none.  TMs are clear.  Pharynx is clear.  Neck is supple.  No thyromegaly.  No carotid bruits.  Chest clear.  Breasts are without masses.  Cardiac exam: Regular rate and rhythm without ectopy.  Abdomen obese soft nondistended without hepatosplenomegaly masses or tenderness.  Trace lower extremity edema.  Impression:  Essential hypertension stable on current regimen of amlodipine, Lasix, metoprolol and olmesartan  Type 2 diabetes mellitus stable with metformin  History of microscopic hematuria previously evaluated by urologist.  History of GE reflux treated with PPI.  Stable  BMI 34  History of restless leg syndrome treated with Requip  Hyperlipidemia treated with statin and stable  Anxiety and situational stress-improving slowly   Most recent functional status assessment:    09/09/2021   10:54 AM  In your present state of health, do you have any difficulty performing the following activities:  Hearing? 0  Vision? 0  Difficulty concentrating or  making decisions? 0  Walking or climbing stairs? 0  Dressing or bathing? 0  Doing errands, shopping? 0  Preparing Food and eating ? N  Using the Toilet? N  In the past six months, have you accidently leaked urine? N  Do you have problems with loss of bowel control? N  Managing your Medications? N  Managing your Finances? N  Housekeeping or managing your Housekeeping? N   Most recent fall risk assessment:    09/09/2021   10:53 AM  Fall Risk   Falls in the past  year? 0  Number falls in past yr: 0  Injury with Fall? 0  Risk for fall due to : No Fall Risks  Follow up Falls evaluation completed    Most recent depression screenings:    09/09/2021   10:53 AM 09/02/2021    9:28 AM  PHQ 2/9 Scores  PHQ - 2 Score 0 0   Most recent cognitive screening:    09/09/2021   10:55 AM  6CIT Screen  What Year? 0 points  What month? 0 points  What time? 0 points  Count back from 20 0 points  Months in reverse 0 points  Repeat phrase 0 points  Total Score 0 points       Assessment & Plan   Type  2 diabetes with Hgb AIC 6.1 % and stable  HTN- improved with adding olmesartan  Triglycerides improved from 217 a year ago to 179 but were much better in March 2023 at 121.  LDL is normal.  HDL is 52.  GE reflux treated with PPI  History of microscopic hematuria previously evaluated by urologist  History of PACs related to anxiety and stress but asymptomatic.  History of lumbar disc herniation L5-S1 treated with anti-inflammatory medication and occasional hydrocodone APAP tablets.  History of adenomatous colon polyps.  Had colonoscopy in 2022.  Obesity  Metabolic syndrome  History of grief  Plan: Currently blood pressure is stable and hemoglobin A1c is also stable at 6.3%.  I think she should continue with current medications and we will follow-up in 6 months.  Vaccines were discussed with her today.  She had influenza vaccine 09/02/2021.  Discussed COVID booster, pneumococcal booster, RSV, Tdap booster.          Annual wellness visit done today including the all of the following: Reviewed patient's Family Medical History Reviewed and updated list of patient's medical providers Assessment of cognitive impairment was done Assessed patient's functional ability Established a written schedule for health screening [place] Completed and Reviewed  Discussed health benefits of physical activity, and encouraged her to  engage in regular exercise appropriate for her age and condition.         {I, Elby Showers, MD, have reviewed all documentation for this visit. The documentation on 09/09/21 for the exam, diagnosis, procedures, and orders are all accurate and complete.   LaVon Barron [place]vine, CMA

## 2021-09-10 LAB — HOUSE ACCOUNT TRACKING

## 2021-09-10 LAB — URINE CULTURE
MICRO NUMBER:: 13885341
Result:: NO GROWTH
SPECIMEN QUALITY:: ADEQUATE

## 2021-09-21 DIAGNOSIS — K5900 Constipation, unspecified: Secondary | ICD-10-CM | POA: Diagnosis not present

## 2021-09-30 ENCOUNTER — Other Ambulatory Visit: Payer: Self-pay | Admitting: Internal Medicine

## 2021-10-03 ENCOUNTER — Other Ambulatory Visit: Payer: Self-pay | Admitting: Internal Medicine

## 2021-11-11 ENCOUNTER — Telehealth: Payer: Self-pay | Admitting: Internal Medicine

## 2021-11-11 NOTE — Telephone Encounter (Signed)
Leah Barron  [phone]  Venna called to say that Leah Barron has been having Vertigo and throwing up since 4:00 this morning, she did say that she also got he PNA shot yesterday and did not feel that great afterwards.

## 2021-11-11 NOTE — Telephone Encounter (Signed)
Called patient back and they verbalized understanding, will take her to urgent care or emergency room.

## 2021-11-12 NOTE — Telephone Encounter (Signed)
Called patient back to check on her, she did not go to UC or ED, she was to weak. She stayed home and took her medication and forced plenty of fluids and is feeling better today. She is able to take regular medication today. Thank me for checking on her.

## 2021-11-15 ENCOUNTER — Other Ambulatory Visit: Payer: Self-pay | Admitting: Internal Medicine

## 2022-01-05 DIAGNOSIS — Z780 Asymptomatic menopausal state: Secondary | ICD-10-CM | POA: Diagnosis not present

## 2022-01-05 DIAGNOSIS — Z1231 Encounter for screening mammogram for malignant neoplasm of breast: Secondary | ICD-10-CM | POA: Diagnosis not present

## 2022-01-05 LAB — HM MAMMOGRAPHY

## 2022-01-05 LAB — HM DEXA SCAN: HM Dexa Scan: NORMAL

## 2022-01-07 ENCOUNTER — Encounter: Payer: Self-pay | Admitting: Internal Medicine

## 2022-01-15 ENCOUNTER — Other Ambulatory Visit: Payer: Self-pay | Admitting: Internal Medicine

## 2022-01-15 DIAGNOSIS — E119 Type 2 diabetes mellitus without complications: Secondary | ICD-10-CM

## 2022-01-19 DIAGNOSIS — D122 Benign neoplasm of ascending colon: Secondary | ICD-10-CM | POA: Diagnosis not present

## 2022-01-19 DIAGNOSIS — K648 Other hemorrhoids: Secondary | ICD-10-CM | POA: Diagnosis not present

## 2022-01-19 DIAGNOSIS — K5730 Diverticulosis of large intestine without perforation or abscess without bleeding: Secondary | ICD-10-CM | POA: Diagnosis not present

## 2022-01-19 DIAGNOSIS — D120 Benign neoplasm of cecum: Secondary | ICD-10-CM | POA: Diagnosis not present

## 2022-01-19 DIAGNOSIS — Z09 Encounter for follow-up examination after completed treatment for conditions other than malignant neoplasm: Secondary | ICD-10-CM | POA: Diagnosis not present

## 2022-01-19 DIAGNOSIS — D123 Benign neoplasm of transverse colon: Secondary | ICD-10-CM | POA: Diagnosis not present

## 2022-01-19 DIAGNOSIS — Z86010 Personal history of colonic polyps: Secondary | ICD-10-CM | POA: Diagnosis not present

## 2022-01-21 DIAGNOSIS — D120 Benign neoplasm of cecum: Secondary | ICD-10-CM | POA: Diagnosis not present

## 2022-01-21 DIAGNOSIS — D123 Benign neoplasm of transverse colon: Secondary | ICD-10-CM | POA: Diagnosis not present

## 2022-02-14 ENCOUNTER — Other Ambulatory Visit: Payer: Self-pay | Admitting: Internal Medicine

## 2022-02-14 ENCOUNTER — Telehealth: Payer: Self-pay | Admitting: Internal Medicine

## 2022-02-14 NOTE — Telephone Encounter (Signed)
CB and changed

## 2022-02-14 NOTE — Telephone Encounter (Signed)
LVM to CB to reschedule appt on 03/11/2022 at 10:30

## 2022-03-03 NOTE — Progress Notes (Addendum)
Patient Care Team: Elby Showers, MD as PCP - General (Internal Medicine)  Visit Date: 03/10/22  Subjective:    Patient ID: Leah Barron , Female   DOB: Apr 27, 1940, 82 y.o.    MRN: [id number]   81 y.o. Female presents today for a 6 month follow-up. 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  The past year has been difficult with losing family members. Just returned from [place] where she attended a funeral.  Had Medicare wellness visit September 2023.  Multidrug regimen required for BP control Denies noncompliance with meds.  History of Type 2 diabetes mellitus treated with Glucophage-XR 500 mg twice daily with meals. HGBA1c at 6.9 on 03/08/22. Eats large amounts of grapes and believes this may be contributing. She would like to work on healthy diet and exercise before adding medications.  History of hyperlipidemia treated with Lipitor 20 mg daily. HDL low at 49, TRIG elevated at 151 on 03/08/22.  History of hypertension treated with Norvasc 5 mg daily, Lopressor 25 mg daily, Benicar 40 mg daily. Blood pressure normal today at 130/76.  History of  dependent edema treated with Lasix 20 mg daily.  History of GERD treated with Prilosec 40 mg daily.     Past Medical History:  Diagnosis Date   Allergy    Anxiety    Cataract    Diabetes mellitus    GERD (gastroesophageal reflux disease)    Hematuria, microscopic    History of recurrent UTIs 05/11/2010   Hyperlipidemia    Positive TB test    Vitamin D deficiency      Family History  Problem Relation Age of Onset   Dementia Mother    Diabetes Father    Diabetes Sister    Heart disease Sister    Diabetes Brother    Kidney disease Brother    HIV Daughter     Social History   Social History Narrative   Not on file      Review of Systems  Constitutional:  Negative  for fever and malaise/fatigue.  HENT:  Negative for congestion.   Eyes:  Negative for blurred vision.  Respiratory:  Negative for cough and shortness of breath.   Cardiovascular:  Negative for chest pain, palpitations and leg swelling.  Gastrointestinal:  Negative for vomiting.  Musculoskeletal:  Negative for back pain.  Skin:  Negative for rash.  Neurological:  Negative for loss of consciousness and headaches.        Objective:   Vitals: BP 130/76   Pulse (!) 52   Temp 98.6 F (37 C) (Tympanic)   Ht 5' (1.524 m)   Wt 186 lb 1.9 oz (84.4 kg)   SpO2 99%   BMI 36.35 kg/m    Physical Exam Vitals and nursing note reviewed.  Constitutional:      General: She is not in acute distress.    Appearance: Normal appearance. She is not toxic-appearing.  HENT:     Head: Normocephalic and atraumatic.  Neck:     Thyroid: No thyroid mass, thyromegaly or thyroid tenderness.     Vascular: No carotid bruit.  Cardiovascular:     Rate and Rhythm: Normal rate and regular rhythm. No extrasystoles are present.    Heart sounds: Normal heart sounds. No murmur heard.    No gallop.  Pulmonary:     Effort: Pulmonary effort is normal.  No respiratory distress.     Breath sounds: Normal breath sounds. No wheezing or rales.  Lymphadenopathy:     Cervical: No cervical adenopathy.  Skin:    General: Skin is warm and dry.  Neurological:     Mental Status: She is alert and oriented to person, place, and time. Mental status is at baseline.  Psychiatric:        Mood and Affect: Mood normal.        Behavior: Behavior normal.        Thought Content: Thought content normal.        Judgment: Judgment normal.       Results:   Studies obtained and personally reviewed by me:   Labs:       Component Value Date/Time   NA 142 09/02/2021 1159   K 4.0 09/02/2021 1159   CL 105 09/02/2021 1159   CO2 30 09/02/2021 1159   GLUCOSE 138 (H) 09/02/2021 1159   BUN 17 09/02/2021 1159   CREATININE 0.92  09/02/2021 1159   CALCIUM 10.0 09/02/2021 1159   PROT 6.5 03/08/2022 1002   ALBUMIN 4.2 01/19/2016 1139   AST 15 03/08/2022 1002   ALT 17 03/08/2022 1002   ALKPHOS 73 01/19/2016 1139   BILITOT 0.4 03/08/2022 1002   GFRNONAA 55 (L) 08/09/2019 0913   GFRAA 64 08/09/2019 0913     Lab Results  Component Value Date   WBC 5.0 09/02/2021   HGB 12.4 09/02/2021   HCT 36.2 09/02/2021   MCV 91.9 09/02/2021   PLT 241 09/02/2021    Lab Results  Component Value Date   CHOL 130 03/08/2022   HDL 49 (L) 03/08/2022   LDLCALC 58 03/08/2022   TRIG 151 (H) 03/08/2022   CHOLHDL 2.7 03/08/2022    Lab Results  Component Value Date   HGBA1C 6.9 (H) 03/08/2022     Lab Results  Component Value Date   TSH 1.01 09/02/2021      Assessment & Plan:   Type 2 diabetes mellitus: treated with Glucophage-XR 500 mg twice daily with meals. HGBA1c at 6.9 on 03/08/22. Eats large amounts of grapes and believes this may be contributing. She would like to work on healthy diet and exercise before adding  other medications.  Hyperlipidemia: treated with Lipitor 20 mg daily. HDL low at 49, TRIG elevated at 151 on 03/08/22.  Hypertension: treated with Norvasc 5 mg daily, Lopressor 25 mg daily, Benicar 40 mg daily. Blood pressure normal today at 130/76.   LE Edema: treated with Lasix 20 mg daily and stable  GERD: treated with Prilosec 40 mg daily and stable  BMI 36.52- watch diet really has issues exercising due to age and joint pain  Vaccine Counseling: Reports UTD on pneumococcal 20 vaccine, received at pharmacy. She will give [place] the date of service for that. Discussed RSV vaccine.  Situational stress and grief- has lost several family members over the past year  Colonoscopy up to date from [facility]. One adenomatous polyp removed 2022.  I,Alexander Ruley,acting as a Education administrator for Elby Showers, MD.,have documented all relevant documentation on the behalf of Elby Showers, MD,as directed by  Elby Showers, MD  while in the presence of Elby Showers, MD.   I, Elby Showers, MD, have reviewed all documentation for this visit. The documentation on 03/10/22 for the exam, diagnosis, procedures, and orders are all accurate and complete.

## 2022-03-08 ENCOUNTER — Other Ambulatory Visit (INDEPENDENT_AMBULATORY_CARE_PROVIDER_SITE_OTHER): Payer: Medicare Other

## 2022-03-08 DIAGNOSIS — Z0000 Encounter for general adult medical examination without abnormal findings: Secondary | ICD-10-CM

## 2022-03-08 DIAGNOSIS — E785 Hyperlipidemia, unspecified: Secondary | ICD-10-CM | POA: Diagnosis not present

## 2022-03-08 DIAGNOSIS — E1169 Type 2 diabetes mellitus with other specified complication: Secondary | ICD-10-CM

## 2022-03-08 LAB — POCT URINALYSIS DIPSTICK
Bilirubin, UA: NEGATIVE
Glucose, UA: NEGATIVE
Ketones, UA: NEGATIVE
Leukocytes, UA: NEGATIVE
Nitrite, UA: NEGATIVE
Protein, UA: NEGATIVE
Spec Grav, UA: 1.01 (ref 1.010–1.025)
Urobilinogen, UA: 0.2 E.U./dL
pH, UA: 5 (ref 5.0–8.0)

## 2022-03-08 NOTE — Addendum Note (Signed)
Addended by: Geradine Girt D on: 03/08/2022 10:10 AM   Modules accepted: Orders

## 2022-03-09 LAB — LIPID PANEL
Cholesterol: 130 mg/dL (ref <200)
HDL: 49 mg/dL — ABNORMAL LOW (ref >=50)
LDL Cholesterol (Calc): 58 mg/dL (calc)
Non-HDL Cholesterol (Calc): 81 mg/dL (calc) (ref <130)
Total CHOL/HDL Ratio: 2.7 (calc) (ref <5.0)
Triglycerides: 151 mg/dL — ABNORMAL HIGH (ref <150)

## 2022-03-09 LAB — MICROALBUMIN / CREATININE URINE RATIO
Creatinine, Urine: 92 mg/dL (ref 20–275)
Microalb Creat Ratio: 5 mcg/mg creat (ref <30)
Microalb, Ur: 0.5 mg/dL

## 2022-03-09 LAB — HEPATIC FUNCTION PANEL
AG Ratio: 1.5 (calc) (ref 1.0–2.5)
ALT: 17 U/L (ref 6–29)
AST: 15 U/L (ref 10–35)
Albumin: 3.9 g/dL (ref 3.6–5.1)
Alkaline phosphatase (APISO): 60 U/L (ref 37–153)
Bilirubin, Direct: 0.1 mg/dL (ref 0.0–0.2)
Globulin: 2.6 g/dL (calc) (ref 1.9–3.7)
Indirect Bilirubin: 0.3 mg/dL (calc) (ref 0.2–1.2)
Total Bilirubin: 0.4 mg/dL (ref 0.2–1.2)
Total Protein: 6.5 g/dL (ref 6.1–8.1)

## 2022-03-09 LAB — HEMOGLOBIN A1C
Hgb A1c MFr Bld: 6.9 % of total Hgb — ABNORMAL HIGH (ref <5.7)
Mean Plasma Glucose: 151 mg/dL
eAG (mmol/L): 8.4 mmol/L

## 2022-03-10 ENCOUNTER — Encounter: Payer: Self-pay | Admitting: Internal Medicine

## 2022-03-10 ENCOUNTER — Ambulatory Visit (INDEPENDENT_AMBULATORY_CARE_PROVIDER_SITE_OTHER): Payer: Medicare Other | Admitting: Internal Medicine

## 2022-03-10 VITALS — BP 130/76 | HR 52 | Temp 98.6°F | Ht 60.0 in | Wt 186.1 lb

## 2022-03-10 DIAGNOSIS — I10 Essential (primary) hypertension: Secondary | ICD-10-CM

## 2022-03-10 DIAGNOSIS — E785 Hyperlipidemia, unspecified: Secondary | ICD-10-CM

## 2022-03-10 DIAGNOSIS — E1169 Type 2 diabetes mellitus with other specified complication: Secondary | ICD-10-CM

## 2022-03-10 DIAGNOSIS — K219 Gastro-esophageal reflux disease without esophagitis: Secondary | ICD-10-CM | POA: Diagnosis not present

## 2022-03-10 DIAGNOSIS — Z86010 Personal history of colonic polyps: Secondary | ICD-10-CM | POA: Diagnosis not present

## 2022-03-10 DIAGNOSIS — Z6836 Body mass index (BMI) 36.0-36.9, adult: Secondary | ICD-10-CM | POA: Diagnosis not present

## 2022-03-10 DIAGNOSIS — Z8659 Personal history of other mental and behavioral disorders: Secondary | ICD-10-CM | POA: Diagnosis not present

## 2022-03-10 DIAGNOSIS — R600 Localized edema: Secondary | ICD-10-CM | POA: Diagnosis not present

## 2022-03-10 NOTE — Patient Instructions (Addendum)
Cut down on grapes and follow up with OV and Hgb AIC in 3 months.BP is stable on current regimen. Try to walk some for exercise and lose a bit of weight. Colonoscopy was done 2022. It was a pleasure to see you today. We are sorry you have had a hard year.

## 2022-03-11 ENCOUNTER — Ambulatory Visit: Payer: Medicare Other | Admitting: Internal Medicine

## 2022-03-17 ENCOUNTER — Other Ambulatory Visit: Payer: Self-pay | Admitting: Internal Medicine

## 2022-05-11 ENCOUNTER — Other Ambulatory Visit: Payer: Self-pay | Admitting: Internal Medicine

## 2022-05-13 DIAGNOSIS — H52223 Regular astigmatism, bilateral: Secondary | ICD-10-CM | POA: Diagnosis not present

## 2022-05-13 DIAGNOSIS — Z9842 Cataract extraction status, left eye: Secondary | ICD-10-CM | POA: Diagnosis not present

## 2022-05-13 DIAGNOSIS — H04123 Dry eye syndrome of bilateral lacrimal glands: Secondary | ICD-10-CM | POA: Diagnosis not present

## 2022-05-13 DIAGNOSIS — Z9841 Cataract extraction status, right eye: Secondary | ICD-10-CM | POA: Diagnosis not present

## 2022-05-13 DIAGNOSIS — H40013 Open angle with borderline findings, low risk, bilateral: Secondary | ICD-10-CM | POA: Diagnosis not present

## 2022-05-13 DIAGNOSIS — E119 Type 2 diabetes mellitus without complications: Secondary | ICD-10-CM | POA: Diagnosis not present

## 2022-05-13 LAB — HM DIABETES EYE EXAM

## 2022-05-19 ENCOUNTER — Encounter: Payer: Self-pay | Admitting: Internal Medicine

## 2022-05-20 ENCOUNTER — Other Ambulatory Visit: Payer: Self-pay | Admitting: Internal Medicine

## 2022-06-14 NOTE — Progress Notes (Signed)
Patient Care Team: Margaree Mackintosh, MD as PCP - General (Internal Medicine)  Visit Date: 06/21/22  Subjective:    Patient ID: Leah Barron , Female   DOB: October 27, 1940, 82 y.o.    MRN: [id number]   82 y.o. Female presents today for a 3 month follow-up.  History of Type 2 diabetes mellitus treated with Glucophage-XR 500 mg twice daily with meals. HGBA1c at 6.8% on 06/16/22, down from 6.9% on 03/08/22.  Anxiety is well controlled with alprazolam 0.5 mg twice daily.   History of hyperlipidemia treated with Lipitor 20 mg daily. Lipids stable in 03/2022.   History of hypertension treated with Norvasc 5 mg daily, Lopressor 25 mg daily, Benicar 40 mg daily. Blood pressure normal today at 136/72.  History of dependent edema treated with Lasix 20 mg daily.   History of GERD treated with Prilosec 40 mg daily.  Past Medical History:  Diagnosis Date   Allergy    Anxiety    Cataract    Diabetes mellitus    GERD (gastroesophageal reflux disease)    Hematuria, microscopic    History of recurrent UTIs 05/11/2010   Hyperlipidemia    Positive TB test    Vitamin D deficiency      Family History  Problem Relation Age of Onset   Dementia Mother    Diabetes Father    Diabetes Sister    Heart disease Sister    Diabetes Brother    Kidney disease Brother    HIV Daughter     Social History   Social History Narrative   Not on file      Review of Systems  Constitutional:  Negative for fever and malaise/fatigue.  HENT:  Negative for congestion.   Eyes:  Negative for blurred vision.  Respiratory:  Negative for cough and shortness of breath.   Cardiovascular:  Negative for chest pain, palpitations and leg swelling.  Gastrointestinal:  Negative for vomiting.  Musculoskeletal:  Negative for back pain.  Skin:  Negative for rash.  Neurological:  Negative for loss of consciousness and headaches.        Objective:   Vitals: BP 136/72   Pulse 64   Temp 97.7 F (36.5 C) (Tympanic)    Resp 16   Ht 5' (1.524 m)   Wt 188 lb 4 oz (85.4 kg)   SpO2 97%   BMI 36.77 kg/m    Physical Exam Vitals and nursing note reviewed.  Constitutional:      General: She is not in acute distress.    Appearance: Normal appearance. She is not toxic-appearing.  HENT:     Head: Normocephalic and atraumatic.  Cardiovascular:     Rate and Rhythm: Normal rate and regular rhythm. No extrasystoles are present.    Pulses: Normal pulses.     Heart sounds: Normal heart sounds. No murmur heard.    No friction rub. No gallop.  Pulmonary:     Effort: Pulmonary effort is normal. No respiratory distress.     Breath sounds: Normal breath sounds. No wheezing or rales.  Musculoskeletal:     Right lower leg: 1+ Edema present.     Left lower leg: 1+ Edema present.  Skin:    General: Skin is warm and dry.  Neurological:     Mental Status: She is alert and oriented to person, place, and time. Mental status is at baseline.  Psychiatric:        Mood and Affect: Mood normal.  Behavior: Behavior normal.        Thought Content: Thought content normal.        Judgment: Judgment normal.       Results:   Studies obtained and personally reviewed by me:   Labs:       Component Value Date/Time   NA 142 09/02/2021 1159   K 4.0 09/02/2021 1159   CL 105 09/02/2021 1159   CO2 30 09/02/2021 1159   GLUCOSE 138 (H) 09/02/2021 1159   BUN 17 09/02/2021 1159   CREATININE 0.92 09/02/2021 1159   CALCIUM 10.0 09/02/2021 1159   PROT 6.5 03/08/2022 1002   ALBUMIN 4.2 01/19/2016 1139   AST 15 03/08/2022 1002   ALT 17 03/08/2022 1002   ALKPHOS 73 01/19/2016 1139   BILITOT 0.4 03/08/2022 1002   GFRNONAA 55 (L) 08/09/2019 0913   GFRAA 64 08/09/2019 0913     Lab Results  Component Value Date   WBC 5.0 09/02/2021   HGB 12.4 09/02/2021   HCT 36.2 09/02/2021   MCV 91.9 09/02/2021   PLT 241 09/02/2021    Lab Results  Component Value Date   CHOL 130 03/08/2022   HDL 49 (L) 03/08/2022    LDLCALC 58 03/08/2022   TRIG 151 (H) 03/08/2022   CHOLHDL 2.7 03/08/2022    Lab Results  Component Value Date   HGBA1C 6.8 (H) 06/16/2022     Lab Results  Component Value Date   TSH 1.01 09/02/2021      Assessment & Plan:   Type 2 diabetes mellitus: treated with Glucophage-XR 500 mg twice daily with meals. HGBA1c at 6.8% on 06/16/22, down from 6.9% on 03/08/22.  Anxiety: well-controlled with alprazolam 0.5 mg twice daily.   Hyperlipidemia: treated with Lipitor 20 mg daily. Lipids stable in 03/2022.   Hypertension: treated with Norvasc 5 mg daily, Lopressor 25 mg daily, Benicar 40 mg daily. Blood pressure normal today at 136/72.   Dependent edema: treated with Lasix 20 mg daily.   GERD: treated with Prilosec 40 mg daily.  Vaccine counseling: discussed yearly vaccine updates, pneumonia vaccine.  Return in 3 months for health maintenance exam or as needed.    I,Alexander Ruley,acting as a Neurosurgeon for Margaree Mackintosh, MD.,have documented all relevant documentation on the behalf of Margaree Mackintosh, MD,as directed by  Margaree Mackintosh, MD while in the presence of Margaree Mackintosh, MD.   I, Margaree Mackintosh, MD, have reviewed all documentation for this visit. The documentation on 06/21/22 for the exam, diagnosis, procedures, and orders are all accurate and complete.

## 2022-06-16 ENCOUNTER — Other Ambulatory Visit: Payer: Medicare Other

## 2022-06-16 DIAGNOSIS — E785 Hyperlipidemia, unspecified: Secondary | ICD-10-CM | POA: Diagnosis not present

## 2022-06-16 DIAGNOSIS — E1169 Type 2 diabetes mellitus with other specified complication: Secondary | ICD-10-CM | POA: Diagnosis not present

## 2022-06-17 LAB — HEMOGLOBIN A1C
Hgb A1c MFr Bld: 6.8 % of total Hgb — ABNORMAL HIGH (ref <5.7)
Mean Plasma Glucose: 148 mg/dL
eAG (mmol/L): 8.2 mmol/L

## 2022-06-19 ENCOUNTER — Other Ambulatory Visit: Payer: Self-pay | Admitting: Internal Medicine

## 2022-06-21 ENCOUNTER — Encounter: Payer: Self-pay | Admitting: Internal Medicine

## 2022-06-21 ENCOUNTER — Ambulatory Visit (INDEPENDENT_AMBULATORY_CARE_PROVIDER_SITE_OTHER): Payer: Medicare Other | Admitting: Internal Medicine

## 2022-06-21 VITALS — BP 136/72 | HR 64 | Temp 97.7°F | Resp 16 | Ht 60.0 in | Wt 188.2 lb

## 2022-06-21 DIAGNOSIS — Z8659 Personal history of other mental and behavioral disorders: Secondary | ICD-10-CM

## 2022-06-21 DIAGNOSIS — E785 Hyperlipidemia, unspecified: Secondary | ICD-10-CM

## 2022-06-21 DIAGNOSIS — Z6836 Body mass index (BMI) 36.0-36.9, adult: Secondary | ICD-10-CM | POA: Diagnosis not present

## 2022-06-21 DIAGNOSIS — I10 Essential (primary) hypertension: Secondary | ICD-10-CM | POA: Diagnosis not present

## 2022-06-21 DIAGNOSIS — E1169 Type 2 diabetes mellitus with other specified complication: Secondary | ICD-10-CM

## 2022-06-21 DIAGNOSIS — K219 Gastro-esophageal reflux disease without esophagitis: Secondary | ICD-10-CM | POA: Diagnosis not present

## 2022-06-21 NOTE — Patient Instructions (Addendum)
It was a pleasure to see you today.  Hemoglobin A1c is 6.8% down a bit from 6.9% in March.  Continue with diet and exercise efforts.  Continue lipid-lowering medication and current antihypertensive medications.  Her blood pressure today is excellent at 136/72.  Continue Lasix for dependent edema and Prilosec for reflux disease.  Health maintenance exam is due in September.

## 2022-08-16 ENCOUNTER — Other Ambulatory Visit: Payer: Self-pay | Admitting: Internal Medicine

## 2022-08-24 NOTE — Telephone Encounter (Signed)
Leah Barron called to check on why this refill had been denied, when I checked on it I seen where you had taken her off the medication on 08/02/2021, however she had gotten a new prescription on 06/17/2021 for 90 pills and 3 refills, so the mail order just kept sending to her and she keep taking it. Now when it has run out of refills she has not taking it and did not realize she should have quit taking it in last year. She has not taking any since 08/19/2022, does she need to stay off of it now. She has an appointment with you 09/15/2022 for her CPE   hydrochlorothiazide (HYDRODIURIL) 25 MG tablet

## 2022-08-25 MED ORDER — HYDROCHLOROTHIAZIDE 25 MG PO TABS: 25.0000 mg | ORAL_TABLET | Freq: Every day | ORAL | 3 refills | Status: DC

## 2022-08-25 NOTE — Addendum Note (Signed)
Addended by: Gregery Na on: 08/25/2022 09:52 AM   Modules accepted: Orders

## 2022-09-01 ENCOUNTER — Other Ambulatory Visit: Payer: Self-pay | Admitting: Internal Medicine

## 2022-09-01 NOTE — Telephone Encounter (Signed)
Refill request for XANAX LAST FILLED ON 05/12/22 #60 + 2 REFILLS

## 2022-09-08 NOTE — Progress Notes (Addendum)
Annual Wellness Visit    Patient Care Team: Margaree Mackintosh, MD as PCP - General (Internal Medicine)  Visit Date: 09/15/22   Chief Complaint  Patient presents with   Medicare Wellness   Annual Exam    Subjective:   Patient: Leah Barron, Female    DOB: [birth year], 82 y.o.   MRN: [id number]  Leah Barron is a 82 y.o. Female who presents today for her Annual Wellness Visit. History of allergies, anxiety, cataract, Type 2 diabetes mellitus, GERD, recurrent UTI, hyperlipidemia, positive TB, Vitamin D deficiency.  History of anxiety treated with alprazolam 0.5 mg twice daily.  History of hypertension treated with amlodipine 5 mg daily, hydrochlorothiazide 25 mg daily, metoprolol tartrate 25 mg daily, olmesartan 40 mg daily. Blood pressure elevated today at 140/80.  History of GERD treated with omeprazole 40 mg daily.  History of edema treated with furosemide 20 mg daily.  History of hyperlipidemia treated with atorvastatin 20 mg daily. HDL low at 46.  History of Type 2 diabetes mellitus treated with metformin 500 mg twice daily with meals. HGBA1c at 6.6% on 09/13/22, down from 6.8% on 06/16/22. She monitors her blood sugar at home.  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  History of left knee osteoarthritis.  History of adenomatous colon polyps and obesity.  Remote history of microscopic hematuria previously evaluated by urologist.   Patient has had cholecystectomy in 1998, hysterectomy without oophorectomy 1991, left breast biopsy 1978, bladder biopsies for microscopic hematuria in 1988 and 1999 both of which were benign.  In 1996 she suffered a left ankle injury secondary to a fall.  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   She is intolerant of codeine and Parafon forte.  Takes hydrocodone APAP sparingly as needed for back pain.  Glucose elevated at 118. Creatinine elevated at 1.09. GFR low at 51. RBC low at  3.79. TSH at 1.21. She was somewhat dehydrated at the time of her blood draw.  She sees Dr. Juliene Pina for gyn care and wonders if she should continue having regular gyn exams and ultrasound studies for ovarian cyst.  Mammogram last completed 01/05/22. No mammographic evidence of malignancy. Recommended repeat in 2025.  Colonoscopy was done 2022. One tubular adenoma found.  Social history: She is a widow.  Husband died from complications of cirrhosis of the liver a number of years ago.  1 daughter living with history of HIV doing well.  1 daughter with history of mental illness.  Patient does not smoke or consume alcohol.  She has a grandson.  She is a Production assistant, radio.  Lives with daughter, grandson Leah Barron, and son-in-law who is a Optician, dispensing currently.   Family history: Large family: 6 brothers total.  1 brother with history of hypertension and kidney failure.  1 brother with history of diabetes, coronary artery disease and mental illness.  1 brother died of lung cancer.  1 sister died with diabetes mellitus, heart failure and kidney failure.  1 sister died of cardiac arrest.  1 daughter, Leah Barron died of cancer during the pandemic.  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  Husband died from complications of cirrhosis of the liver number of years ago.  1 daughter living with history of HIV doing well.  1 daughter with history of mental illness.  Patient does not smoke or consume alcohol.  She has a grandson.  She is a Production assistant, radio.  Currently  lives with daughter, grandson and son-in-law who is a Optician, dispensing.  Family history: She has a large family: 6 brothers total.  1 brother with history of hypertension and kidney failure.  1 brother with history of diabetes heart disease and mental illness.  1 brother died of lung cancer.  1 sister died with diabetes mellitus complications, heart failure and kidney failure.  1 sister died of cardiac arrest.  1 daughter, Leah Barron, died of cancer during the pandemic.    Review of Systems  Constitutional:  Negative for chills, fever, malaise/fatigue and weight loss.  HENT:  Negative for hearing loss, sinus pain and sore throat.   Respiratory:  Negative for cough, hemoptysis and shortness of breath.   Cardiovascular:  Negative for chest pain, palpitations, leg swelling and PND.  Gastrointestinal:  Negative for abdominal pain, constipation, diarrhea, heartburn, nausea and vomiting.  Genitourinary:  Negative for dysuria, frequency and urgency.  Musculoskeletal:  Negative for back pain, myalgias and neck pain.  Skin:  Negative for itching and rash.  Neurological:  Negative for dizziness, tingling, seizures and headaches.  Endo/Heme/Allergies:  Negative for polydipsia.  Psychiatric/Behavioral:  Negative for depression. The patient is not nervous/anxious.       Objective:   Vitals: BP (!) 140/80   Pulse 64   Ht 5' (1.524 m)   Wt 185 lb (83.9 kg)   SpO2 98%   BMI 36.13 kg/m   Physical Exam Vitals and nursing note reviewed.  Constitutional:      General: She is not in acute distress.    Appearance: Normal appearance. She is not ill-appearing or toxic-appearing.  HENT:     Head: Normocephalic and atraumatic.     Right Ear: Hearing, tympanic membrane, ear canal and external ear normal.     Left Ear: Hearing, tympanic membrane, ear canal and external ear normal.     Mouth/Throat:     Pharynx: Oropharynx is clear.  Eyes:     Extraocular Movements: Extraocular movements intact.     Pupils: Pupils  are equal, round, and reactive to light.  Neck:     Thyroid: No thyroid mass, thyromegaly or thyroid tenderness.     Vascular: No carotid bruit.  Cardiovascular:     Rate and Rhythm: Normal rate and regular rhythm. No extrasystoles are present.    Pulses:          Dorsalis pedis pulses are 2+ on the right side and 2+ on the left side.     Heart sounds: Normal heart sounds. No murmur heard.    No friction rub. No gallop.  Pulmonary:     Effort: Pulmonary effort is normal.     Breath sounds: Normal breath sounds. No decreased breath sounds, wheezing, rhonchi or rales.  Chest:     Chest wall: No mass.  Abdominal:     Palpations: Abdomen is soft. There  is no hepatomegaly, splenomegaly or mass.     Tenderness: There is no abdominal tenderness.     Hernia: No hernia is present.  Musculoskeletal:     Cervical back: Normal range of motion.     Right lower leg: No edema.     Left lower leg: No edema.  Feet:     Comments: No lesions. Feet are warm to touch. Lymphadenopathy:     Cervical: No cervical adenopathy.     Upper Body:     Right upper body: No supraclavicular adenopathy.     Left upper body: No supraclavicular adenopathy.  Skin:    General: Skin is warm and dry.  Neurological:     General: No focal deficit present.     Mental Status: She is alert and oriented to person, place, and time. Mental status is at baseline.     Sensory: Sensation is intact.     Motor: Motor function is intact. No weakness.     Deep Tendon Reflexes: Reflexes are normal and symmetric.  Psychiatric:        Attention and Perception: Attention normal.        Mood and Affect: Mood normal.        Speech: Speech normal.        Behavior: Behavior normal.        Thought Content: Thought content normal.        Cognition and Memory: Cognition normal.        Judgment: Judgment normal.      Most recent functional status assessment:    09/15/2022   10:59 AM  In your present state of health, do you have  any difficulty performing the following activities:  Hearing? 0  Vision? 0  Difficulty concentrating or making decisions? 1  Walking or climbing stairs? 0  Dressing or bathing? 0  Doing errands, shopping? 0  Preparing Food and eating ? N  Using the Toilet? N  In the past six months, have you accidently leaked urine? N  Do you have problems with loss of bowel control? N  Managing your Medications? N  Managing your Finances? N  Housekeeping or managing your Housekeeping? N   Most recent fall risk assessment:    09/15/2022   10:52 AM  Fall Risk   Falls in the past year? 1  Number falls in past yr: 1  Injury with Fall? 1  Risk for fall due to : Other (Comment)    Most recent depression screenings:    09/15/2022   10:52 AM 03/10/2022   10:30 AM  PHQ 2/9 Scores  PHQ - 2 Score 0 0   Most recent cognitive screening:    09/15/2022   10:55 AM  6CIT Screen  What Year? 0 points  What month? 0 points  What time? 0 points  Count back from 20 0 points  Months in reverse 0 points  Repeat phrase 0 points  Total Score 0 points     Results:   Studies obtained and personally reviewed by me:  Mammogram last completed 01/05/22. No mammographic evidence of malignancy. Recommended repeat in 2025.  Colonoscopy was done 2022. One tubular adenoma found.  Labs:       Component Value Date/Time   NA 142 09/13/2022 0955   K 3.8 09/13/2022 0955   CL 104 09/13/2022 0955   CO2 29 09/13/2022 0955   GLUCOSE 118 (H) 09/13/2022 0955   BUN 21 09/13/2022 0955   CREATININE 1.09 (H) 09/13/2022 1610  CALCIUM 9.7 09/13/2022 0955   PROT 6.7 09/13/2022 0955   ALBUMIN 4.2 01/19/2016 1139   AST 13 09/13/2022 0955   ALT 10 09/13/2022 0955   ALKPHOS 73 01/19/2016 1139   BILITOT 0.5 09/13/2022 0955   GFRNONAA 55 (L) 08/09/2019 0913   GFRAA 64 08/09/2019 0913     Lab Results  Component Value Date   WBC 6.1 09/13/2022   HGB 12.0 09/13/2022   HCT 35.8 09/13/2022   MCV 94.5 09/13/2022   PLT  277 09/13/2022    Lab Results  Component Value Date   CHOL 128 09/13/2022   HDL 46 (L) 09/13/2022   LDLCALC 59 09/13/2022   TRIG 148 09/13/2022   CHOLHDL 2.8 09/13/2022    Lab Results  Component Value Date   HGBA1C 6.6 (H) 09/13/2022     Lab Results  Component Value Date   TSH 1.21 09/13/2022    Assessment & Plan:   Anxiety: treated with alprazolam 0.5 mg twice daily.  Hypertension: treated with amlodipine 5 mg daily, hydrochlorothiazide 25 mg daily, metoprolol tartrate 25 mg daily, olmesartan 40 mg daily. Blood pressure elevated today at 140/80.  GERD: stable with omeprazole 40 mg daily.  Edema: treated with furosemide 20 mg daily.  Hyperlipidemia: treated with atorvastatin 20 mg daily. HDL low at 46.  BMI 36-needs to lose 20 pounds in the next 6 months  Type 2 diabetes mellitus: treated with metformin 500 mg twice daily with meals. HGBA1c at 6.6% on 09/13/22, down from 6.8% on 06/16/22. She monitors her blood sugar at home.  Urinalysis normal today.  Pelvic exam deferred to gynecologist. Recommended she continue with gyn exams.  Mammogram last completed 01/05/22. No mammographic evidence of malignancy. Reports she has a repeat scheduled for 1/25.  Colonoscopy was done 2022. One tubular adenoma found.  Vaccine counseling: UTD on tetanus, shingles vaccines. Administered flu vaccine.  Return in 6 months for follow-up.  Work on diet exercise and weight loss.  Needs to walk a little every day and watch her diet.  No change in medications.     Annual wellness visit done today including the all of the following: Reviewed patient's Family Medical History Reviewed and updated list of patient's medical providers Assessment of cognitive impairment was done Assessed patient's functional ability Established a written schedule for health screening services Health Risk Assessent Completed and Reviewed  Discussed health benefits of physical activity, and encouraged her to  engage in regular exercise appropriate for her age and condition.        I,Alexander Ruley,acting as a Neurosurgeon for Margaree Mackintosh, MD.,have documented all relevant documentation on the behalf of Margaree Mackintosh, MD,as directed by  Margaree Mackintosh, MD while in the presence of Margaree Mackintosh, MD.   I, Margaree Mackintosh, MD, have reviewed all documentation for this visit. The documentation on 09/24/22 for the exam, diagnosis, procedures, and orders are all accurate and complete.

## 2022-09-13 ENCOUNTER — Other Ambulatory Visit: Payer: Medicare Other

## 2022-09-13 DIAGNOSIS — E1169 Type 2 diabetes mellitus with other specified complication: Secondary | ICD-10-CM | POA: Diagnosis not present

## 2022-09-13 DIAGNOSIS — E119 Type 2 diabetes mellitus without complications: Secondary | ICD-10-CM

## 2022-09-13 DIAGNOSIS — F411 Generalized anxiety disorder: Secondary | ICD-10-CM

## 2022-09-13 DIAGNOSIS — I10 Essential (primary) hypertension: Secondary | ICD-10-CM | POA: Diagnosis not present

## 2022-09-13 DIAGNOSIS — E785 Hyperlipidemia, unspecified: Secondary | ICD-10-CM | POA: Diagnosis not present

## 2022-09-13 DIAGNOSIS — Z1329 Encounter for screening for other suspected endocrine disorder: Secondary | ICD-10-CM

## 2022-09-13 DIAGNOSIS — Z0000 Encounter for general adult medical examination without abnormal findings: Secondary | ICD-10-CM

## 2022-09-14 LAB — LIPID PANEL
Cholesterol: 128 mg/dL (ref <200)
HDL: 46 mg/dL — ABNORMAL LOW (ref >=50)
LDL Cholesterol (Calc): 59 mg/dL
Non-HDL Cholesterol (Calc): 82 mg/dL (ref <130)
Total CHOL/HDL Ratio: 2.8 (calc) (ref <5.0)
Triglycerides: 148 mg/dL (ref <150)

## 2022-09-14 LAB — COMPLETE METABOLIC PANEL WITH GFR
AG Ratio: 1.6 (calc) (ref 1.0–2.5)
ALT: 10 U/L (ref 6–29)
AST: 13 U/L (ref 10–35)
Albumin: 4.1 g/dL (ref 3.6–5.1)
Alkaline phosphatase (APISO): 83 U/L (ref 37–153)
BUN/Creatinine Ratio: 19 (calc) (ref 6–22)
BUN: 21 mg/dL (ref 7–25)
CO2: 29 mmol/L (ref 20–32)
Calcium: 9.7 mg/dL (ref 8.6–10.4)
Chloride: 104 mmol/L (ref 98–110)
Creat: 1.09 mg/dL — ABNORMAL HIGH (ref 0.60–0.95)
Globulin: 2.6 g/dL (ref 1.9–3.7)
Glucose, Bld: 118 mg/dL — ABNORMAL HIGH (ref 65–99)
Potassium: 3.8 mmol/L (ref 3.5–5.3)
Sodium: 142 mmol/L (ref 135–146)
Total Bilirubin: 0.5 mg/dL (ref 0.2–1.2)
Total Protein: 6.7 g/dL (ref 6.1–8.1)
eGFR: 51 mL/min/{1.73_m2} — ABNORMAL LOW (ref >=60)

## 2022-09-14 LAB — CBC WITH DIFFERENTIAL/PLATELET
Absolute Monocytes: 488 {cells}/uL (ref 200–950)
Basophils Absolute: 18 {cells}/uL (ref 0–200)
Basophils Relative: 0.3 %
Eosinophils Absolute: 49 {cells}/uL (ref 15–500)
Eosinophils Relative: 0.8 %
HCT: 35.8 % (ref 35.0–45.0)
Hemoglobin: 12 g/dL (ref 11.7–15.5)
Lymphs Abs: 2342 {cells}/uL (ref 850–3900)
MCH: 31.7 pg (ref 27.0–33.0)
MCHC: 33.5 g/dL (ref 32.0–36.0)
MCV: 94.5 fL (ref 80.0–100.0)
MPV: 9.5 fL (ref 7.5–12.5)
Monocytes Relative: 8 %
Neutro Abs: 3203 {cells}/uL (ref 1500–7800)
Neutrophils Relative %: 52.5 %
Platelets: 277 10*3/uL (ref 140–400)
RBC: 3.79 10*6/uL — ABNORMAL LOW (ref 3.80–5.10)
RDW: 13.2 % (ref 11.0–15.0)
Total Lymphocyte: 38.4 %
WBC: 6.1 10*3/uL (ref 3.8–10.8)

## 2022-09-14 LAB — MICROALBUMIN / CREATININE URINE RATIO
Creatinine, Urine: 122 mg/dL (ref 20–275)
Microalb Creat Ratio: 7 mg/g{creat} (ref <30)
Microalb, Ur: 0.9 mg/dL

## 2022-09-14 LAB — HEMOGLOBIN A1C
Hgb A1c MFr Bld: 6.6 %{Hb} — ABNORMAL HIGH (ref <5.7)
Mean Plasma Glucose: 143 mg/dL
eAG (mmol/L): 7.9 mmol/L

## 2022-09-14 LAB — TSH: TSH: 1.21 m[IU]/L (ref 0.40–4.50)

## 2022-09-15 ENCOUNTER — Ambulatory Visit (INDEPENDENT_AMBULATORY_CARE_PROVIDER_SITE_OTHER): Payer: Medicare Other | Admitting: Internal Medicine

## 2022-09-15 ENCOUNTER — Encounter: Payer: Self-pay | Admitting: Internal Medicine

## 2022-09-15 VITALS — BP 140/80 | HR 64 | Ht 60.0 in | Wt 185.0 lb

## 2022-09-15 DIAGNOSIS — Z0000 Encounter for general adult medical examination without abnormal findings: Secondary | ICD-10-CM | POA: Diagnosis not present

## 2022-09-15 DIAGNOSIS — K219 Gastro-esophageal reflux disease without esophagitis: Secondary | ICD-10-CM | POA: Diagnosis not present

## 2022-09-15 DIAGNOSIS — Z86010 Personal history of colonic polyps: Secondary | ICD-10-CM

## 2022-09-15 DIAGNOSIS — F411 Generalized anxiety disorder: Secondary | ICD-10-CM | POA: Diagnosis not present

## 2022-09-15 DIAGNOSIS — Z23 Encounter for immunization: Secondary | ICD-10-CM

## 2022-09-15 DIAGNOSIS — I10 Essential (primary) hypertension: Secondary | ICD-10-CM

## 2022-09-15 DIAGNOSIS — Z6836 Body mass index (BMI) 36.0-36.9, adult: Secondary | ICD-10-CM | POA: Diagnosis not present

## 2022-09-15 DIAGNOSIS — R600 Localized edema: Secondary | ICD-10-CM

## 2022-09-15 DIAGNOSIS — E785 Hyperlipidemia, unspecified: Secondary | ICD-10-CM

## 2022-09-15 DIAGNOSIS — E1169 Type 2 diabetes mellitus with other specified complication: Secondary | ICD-10-CM | POA: Diagnosis not present

## 2022-09-15 DIAGNOSIS — Z8659 Personal history of other mental and behavioral disorders: Secondary | ICD-10-CM

## 2022-09-15 LAB — POCT URINALYSIS DIP (CLINITEK)
Bilirubin, UA: NEGATIVE
Blood, UA: NEGATIVE
Glucose, UA: NEGATIVE mg/dL
Ketones, POC UA: NEGATIVE mg/dL
Leukocytes, UA: NEGATIVE
Nitrite, UA: NEGATIVE
POC PROTEIN,UA: NEGATIVE
Spec Grav, UA: 1.015 (ref 1.010–1.025)
Urobilinogen, UA: 0.2 U/dL
pH, UA: 6.5 (ref 5.0–8.0)

## 2022-09-22 ENCOUNTER — Other Ambulatory Visit: Payer: Self-pay | Admitting: Internal Medicine

## 2022-09-24 NOTE — Patient Instructions (Signed)
Patient is to lose 20 pounds in the next 6 months.  She has gained some weight.  Blood pressure needs to be a bit better.  Needs to watch her blood pressure and call me if persistently elevated.  Continue with lipid-lowering medication and furosemide 20 mg daily.  Continue omeprazole 40 mg daily and alprazolam for anxiety.  Watch blood sugar more closely.  Mammogram is due January 2025.  Given flu vaccine today.  Return in 6 months.

## 2022-09-28 ENCOUNTER — Other Ambulatory Visit: Payer: Self-pay | Admitting: Internal Medicine

## 2022-09-28 DIAGNOSIS — E119 Type 2 diabetes mellitus without complications: Secondary | ICD-10-CM

## 2022-10-27 ENCOUNTER — Other Ambulatory Visit: Payer: Self-pay

## 2022-10-27 ENCOUNTER — Telehealth: Payer: Self-pay | Admitting: Internal Medicine

## 2022-10-27 ENCOUNTER — Other Ambulatory Visit: Payer: Self-pay | Admitting: Internal Medicine

## 2022-10-27 MED ORDER — ACCU-CHEK GUIDE VI STRP: ORAL_STRIP | 2 refills | Status: DC

## 2022-10-27 NOTE — Telephone Encounter (Signed)
Patient called and said that she ordered her Diabetes testing kit from her pharmacy and they told her they would have to have approval from her doctor before they would send it. Please advise [facility] - [place], [place] - [address] [place]  [address] Phone: [phone]  Fax: [phone]

## 2022-11-10 ENCOUNTER — Other Ambulatory Visit: Payer: Self-pay

## 2022-11-10 DIAGNOSIS — E119 Type 2 diabetes mellitus without complications: Secondary | ICD-10-CM

## 2022-11-10 MED ORDER — ACCU-CHEK GUIDE VI STRP: ORAL_STRIP | 2 refills | Status: DC

## 2022-11-10 MED ORDER — ACCU-CHEK SOFTCLIX LANCETS MISC: 2 refills | Status: DC

## 2022-11-10 MED ORDER — ACCU-CHEK GUIDE ME W/DEVICE KIT: PACK | 0 refills | Status: AC

## 2022-12-01 ENCOUNTER — Other Ambulatory Visit: Payer: Self-pay | Admitting: Internal Medicine

## 2022-12-12 ENCOUNTER — Ambulatory Visit (INDEPENDENT_AMBULATORY_CARE_PROVIDER_SITE_OTHER): Payer: Medicare Other | Admitting: Internal Medicine

## 2022-12-12 ENCOUNTER — Encounter: Payer: Self-pay | Admitting: Internal Medicine

## 2022-12-12 VITALS — BP 130/70 | HR 56 | Ht 60.0 in | Wt 184.0 lb

## 2022-12-12 DIAGNOSIS — E1169 Type 2 diabetes mellitus with other specified complication: Secondary | ICD-10-CM

## 2022-12-12 DIAGNOSIS — Z8659 Personal history of other mental and behavioral disorders: Secondary | ICD-10-CM | POA: Diagnosis not present

## 2022-12-12 DIAGNOSIS — E785 Hyperlipidemia, unspecified: Secondary | ICD-10-CM

## 2022-12-12 DIAGNOSIS — I10 Essential (primary) hypertension: Secondary | ICD-10-CM | POA: Diagnosis not present

## 2022-12-12 NOTE — Progress Notes (Addendum)
Patient Care Team: Margaree Mackintosh, MD as PCP - General (Internal Medicine)  Visit Date: 12/12/22  Subjective:    Patient ID: Leah Barron , Female   DOB: December 09, 1940, 82 y.o.    MRN: [id number]   82 y.o. Female presents today for blood pressure recheck. History of hypertension treated with amlodipine 5 mg daily, hydrochlorothiazide 25 mg daily, metoprolol tartrate 25 mg daily, olmesartan 40 mg daily. She is compliant with these. During the dates 12/5 - 12/9 systolic blood pressure ranged form 111-132, diastolic ranged from 60-71. UTD on flu, Covid-19 vaccines. Blood pressure normal in-office today at 130/70. Reports she has some numbness/tingling in bilateral fingers. Takes multivitamin with Vitamin B. She tried to order a new blood sugar monitor but was unable to get it approved. Her current monitor is working and she uses it regularly.  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 1 daughter living with HIV doing well.One grandson. Does not smoke or consume alcohol.     Review of Systems  Constitutional:  Negative for fever and malaise/fatigue.  HENT:  Negative for congestion.   Eyes:  Negative for blurred vision.  Respiratory:  Negative for cough and shortness of breath.   Cardiovascular:  Negative for chest pain, palpitations and leg swelling.  Gastrointestinal:  Negative for vomiting.  Musculoskeletal:  Negative for back pain.  Skin:  Negative for rash.  Neurological:  Negative for loss of consciousness and  headaches.        Objective:   Vitals: BP 130/70   Pulse (!) 56   Ht 5' (1.524 m)   Wt 184 lb (83.5 kg)   SpO2 98%   BMI 35.94 kg/m    Physical Exam Vitals and nursing note reviewed.  Constitutional:      General: She is not in acute distress.    Appearance: Normal appearance. She is not toxic-appearing.  HENT:     Head: Normocephalic and atraumatic.  Neck:     Vascular: No carotid bruit.  Cardiovascular:     Rate and Rhythm: Normal rate and regular rhythm. No extrasystoles are present.    Pulses: Normal pulses.     Heart sounds: Normal heart sounds. No murmur heard.    No friction rub. No gallop.     Comments: Trace pitting edema bilaterally. Pulmonary:     Effort: Pulmonary effort is normal. No respiratory distress.     Breath sounds: Normal breath sounds. No wheezing or rales.  Skin:    General: Skin is warm and dry.  Neurological:     Mental Status: She is alert and oriented to person, place, and time. Mental status is at baseline.  Psychiatric:        Mood and Affect: Mood normal.        Behavior: Behavior normal.        Thought  Content: Thought content normal.        Judgment: Judgment normal.       Results:   Studies obtained and personally reviewed by me:   Labs:       Component Value Date/Time   NA 142 09/13/2022 0955   K 3.8 09/13/2022 0955   CL 104 09/13/2022 0955   CO2 29 09/13/2022 0955   GLUCOSE 118 (H) 09/13/2022 0955   BUN 21 09/13/2022 0955   CREATININE 1.09 (H) 09/13/2022 0955   CALCIUM 9.7 09/13/2022 0955   PROT 6.7 09/13/2022 0955   ALBUMIN 4.2 01/19/2016 1139   AST 13 09/13/2022 0955   ALT 10 09/13/2022 0955   ALKPHOS 73 01/19/2016 1139   BILITOT 0.5 09/13/2022 0955   GFRNONAA 55 (L) 08/09/2019 0913   GFRAA 64 08/09/2019 0913     Lab Results  Component Value Date   WBC 6.1 09/13/2022   HGB 12.0 09/13/2022   HCT 35.8 09/13/2022   MCV 94.5 09/13/2022   PLT 277 09/13/2022    Lab Results  Component Value Date   CHOL  128 09/13/2022   HDL 46 (L) 09/13/2022   LDLCALC 59 09/13/2022   TRIG 148 09/13/2022   CHOLHDL 2.8 09/13/2022    Lab Results  Component Value Date   HGBA1C 6.6 (H) 09/13/2022     Lab Results  Component Value Date   TSH 1.21 09/13/2022      Assessment & Plan:   Hypertension: continue current regimen. BP stable. No changes in BP meds  Anxiety-stable with Xanax- seems less anxious  Type 2 diabetes- Hgb AIC 6.6% in September. Recheck in the Spring.  Hyperlipidemia- continue statin  RTC in March 2025 for 6 month recheck    I,Alexander Ruley,acting as a scribe for Margaree Mackintosh, MD.,have documented all relevant documentation on the behalf of Margaree Mackintosh, MD,as directed by  Margaree Mackintosh, MD while in the presence of Margaree Mackintosh, MD.   I, Margaree Mackintosh, MD, have reviewed all documentation for this visit. The documentation on 12/31/22 for the exam, diagnosis, procedures, and orders are all accurate and complete.

## 2022-12-14 ENCOUNTER — Telehealth: Payer: Self-pay

## 2022-12-14 NOTE — Progress Notes (Signed)
   Care Guide Note  12/14/2022 Name: Leah Barron MRN: [id number] DOB: 14-Jun-1940  Referred by: Margaree Mackintosh, MD Reason for referral : Care Coordination (Outreach to schedule with Pharm d )   Leah Barron is a 82 y.o. year old female who is a primary care patient of Baxley, Luanna Cole, MD. Leah Barron was referred to the pharmacist for assistance related to DM.    Successful contact was made with the patient to discuss pharmacy services including being ready for the pharmacist to call at least 5 minutes before the scheduled appointment time, to have medication bottles and any blood sugar or blood pressure readings ready for review. The patient agreed to meet with the pharmacist via with the pharmacist via telephone visit on (date/time).  01/02/2023  Penne Lash , RMA     [employer]  [facility], [facility] Guide  Direct Dial: [phone]  Website: [email or web address]

## 2022-12-23 ENCOUNTER — Other Ambulatory Visit: Payer: Self-pay | Admitting: Internal Medicine

## 2022-12-31 NOTE — Patient Instructions (Signed)
It was a pleasure to see you today. RTC in March 2025 for 6 month recheck. No change in meds. BP is stable.

## 2023-01-02 ENCOUNTER — Other Ambulatory Visit: Payer: Self-pay | Admitting: Pharmacist

## 2023-01-02 NOTE — Progress Notes (Signed)
   01/02/2023  Patient ID: [name], female   DOB: 03/30/1940, 82 y.o.   MRN: [id number]  Called and spoke with the patient regarding her medications today.  Called [facility] prior to scheduled call. Submitted verbal Rx for Accu-Chek Guide meter. Advised they filled it for the Accu-chek Guide vs her usual Accu-chek Guide ME, so it may look a little different but work the same. Fill history shows it was processed by the pharmacy earlier today.  Discussed that her hydrochlorothiazide + Furosemide are appropriate together and that she is recommended to take both in the morning. Expressed issues with urination at night. Says she pees a decent amount is not an just incontinence-based. Advised that her medications should mostly be worn off by then, so that should not be the cause of excessive urination. Reminded her to stay hydrated, but stop drinking liquids about 3 hours prior to bedtime. Also avoid tea as this may worsen her concerns.  Discussed CGM's as well. Advised that her CGM would be about $500 as it would NOT be covered by insurance due to lack of daily insulin use. Will continue using fingersticks for now. Advised that she does not need to check twice a day as she does not have a medication to correct her high readings anyways.    Marlowe Aschoff, PharmD [facility] Phone Number: [phone]

## 2023-01-09 DIAGNOSIS — Z1231 Encounter for screening mammogram for malignant neoplasm of breast: Secondary | ICD-10-CM | POA: Diagnosis not present

## 2023-01-09 LAB — HM MAMMOGRAPHY

## 2023-01-10 ENCOUNTER — Encounter: Payer: Self-pay | Admitting: Internal Medicine

## 2023-01-31 ENCOUNTER — Other Ambulatory Visit: Payer: Self-pay | Admitting: Internal Medicine

## 2023-02-01 ENCOUNTER — Other Ambulatory Visit: Payer: Self-pay | Admitting: Internal Medicine

## 2023-02-07 ENCOUNTER — Telehealth: Payer: Self-pay | Admitting: Internal Medicine

## 2023-02-07 NOTE — Telephone Encounter (Signed)
Called patient to let her know she can take medication with water and black coffee. She verbalized understanding

## 2023-02-07 NOTE — Telephone Encounter (Signed)
Copied from CRM [id number]. Topic: Clinical - Medical Advice >> Feb 07, 2023  2:54 PM Elle L wrote: Reason for CRM: The patient is wanting to verify if she needs to take her medication or not before her lab work on 3/11. Her call back number is [phone].

## 2023-02-25 ENCOUNTER — Other Ambulatory Visit: Payer: Self-pay | Admitting: Internal Medicine

## 2023-03-10 NOTE — Progress Notes (Shared)
 Patient Care Team: Margaree Mackintosh, MD as PCP - General (Internal Medicine)  Visit Date: 03/16/23  Subjective:   Chief Complaint  Patient presents with   Medical Management of Chronic Issues   6 month recheck  Patient ZO:XWRUEA Leah, Leah Barron DOB:11/24/1940,82 y.o. VWU:[id number]   83 y.o. Female presents today for 6 months follow-up for DMII; HLD. 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 Last seen 12/12/2022 in this office for HTN; Anxiety; HLD associated w/DMII, in the interim had her mammogram 01/2023, which was normal with recommended repeat of 2026.    History of Hypertension treated with 5 mg Amlodipine daily, 25 mg Metoprolol tartrate daily, 40 mg Olmesartan daily. Blood Pressure: normotensive today at 130/70. Dependant Edema treated with 20 mg Lasix daily and 25 mg HCTZ daily.   History of Hyperlipidemia treated with  20 mg Atorvastatin daily. 03/14/2023 Lipid Panel w/ LDL 159, elevated from 148 in 09/2022, HDL back to WNL at 52 from 46.  History of Diabetes Mellitus, type II treated with 500 mg Metformin BID. 03/14/2023 HgbA1c 6.8, elevated from 6.6 in 09/2022 - she has remained consistently within 6%, with her highest being 6.9 in March 2024.   Reviewed 03/14/2023 Hepatic Function Panel: WNL.   History of mild Chronic Kidney Disease 09/2022 Microalb/Creat WNL - per patient request ordering BMET  BMI is 36.33 today.    History of GERD treated with Prilosec 40 mg daily. Past Medical History:  Diagnosis Date   Allergy    Anxiety    Cataract    Diabetes mellitus    GERD (gastroesophageal reflux disease)    Hematuria, microscopic    History of recurrent UTIs 05/11/2010   Hyperlipidemia    Positive TB test    Vitamin D deficiency     Allergies  Allergen Reactions   Chlorzoxazone     Unknown reaction   Codeine Nausea Only    Family  History  Problem Relation Age of Onset   Dementia Mother    Diabetes Father    Diabetes Sister    Heart disease Sister    Diabetes Brother    Kidney disease Brother    HIV Daughter    Social History   Social History Narrative   Not on file   Review of Systems  Constitutional:  Negative for fever and malaise/fatigue.  HENT:  Negative for congestion.   Eyes:  Negative for blurred vision.  Respiratory:  Negative for cough and shortness of breath.   Cardiovascular:  Negative for chest pain, palpitations and leg swelling.  Gastrointestinal:  Negative for vomiting.  Musculoskeletal:  Negative for back pain.  Skin:  Negative for rash.  Neurological:  Negative for loss of consciousness and headaches.     Objective:  Vitals: BP 130/70   Pulse 60   Temp 98.2 F (36.8 C)   Ht 5' (1.524 m)   Wt 186 lb (84.4 kg)   SpO2 99%   BMI 36.33 kg/m   Physical Exam Vitals and nursing note reviewed.  Constitutional:      General: She is not in acute distress.    Appearance: Normal appearance. She is not toxic-appearing.  HENT:     Head: Normocephalic and atraumatic.  Cardiovascular:     Rate and Rhythm: Normal rate and regular rhythm. No extrasystoles are present.    Pulses: Normal pulses.     Heart sounds: Normal heart  sounds. No murmur heard.    No friction rub. No gallop.  Pulmonary:     Effort: Pulmonary effort is normal. No respiratory distress.     Breath sounds: Normal breath sounds. No wheezing or rales.  Skin:    General: Skin is warm and dry.  Neurological:     Mental Status: She is alert and oriented to person, place, and time. Mental status is at baseline.  Psychiatric:        Mood and Affect: Mood normal.        Behavior: Behavior normal.        Thought Content: Thought content normal.        Judgment: Judgment normal.     Results:  Studies Obtained And Personally Reviewed By Me:  Mammogram 01/2023, which was normal with recommended repeat of 2026.    Diabetic  Foot Exam - Simple   Simple Foot Form Diabetic Foot exam was performed with the following findings: Yes 03/16/2023 10:45 AM  Visual Inspection No deformities, no ulcerations, no other skin breakdown bilaterally: Yes Sensation Testing Intact to touch and monofilament testing bilaterally: Yes Pulse Check Posterior Tibialis and Dorsalis pulse intact bilaterally: Yes Comments    Labs:     Component Value Date/Time   NA 142 09/13/2022 0955   K 3.8 09/13/2022 0955   CL 104 09/13/2022 0955   CO2 29 09/13/2022 0955   GLUCOSE 118 (H) 09/13/2022 0955   BUN 21 09/13/2022 0955   CREATININE 1.09 (H) 09/13/2022 0955   CALCIUM 9.7 09/13/2022 0955   PROT 7.0 03/14/2023 1107   ALBUMIN 4.2 01/19/2016 1139   AST 12 03/14/2023 1107   ALT 10 03/14/2023 1107   ALKPHOS 73 01/19/2016 1139   BILITOT 0.5 03/14/2023 1107   GFRNONAA 55 (L) 08/09/2019 0913   GFRAA 64 08/09/2019 0913    Lab Results  Component Value Date   WBC 6.1 09/13/2022   HGB 12.0 09/13/2022   HCT 35.8 09/13/2022   MCV 94.5 09/13/2022   PLT 277 09/13/2022   Lab Results  Component Value Date   CHOL 137 03/14/2023   HDL 52 03/14/2023   LDLCALC 61 03/14/2023   TRIG 159 (H) 03/14/2023   CHOLHDL 2.6 03/14/2023   Lab Results  Component Value Date   HGBA1C 6.8 (H) 03/14/2023    Lab Results  Component Value Date   TSH 1.21 09/13/2022   Assessment & Plan:  Hypertension treated with 5 mg Amlodipine daily, 25 mg Metoprolol tartrate daily, 40 mg Olmesartan daily. Blood Pressure: normotensive today at 130/70. Dependant Edema treated with 20 mg Lasix daily and 25 mg HCTZ daily.   Hyperlipidemia treated with  20 mg Atorvastatin daily. 03/14/2023 Lipid Panel w/ LDL 159, elevated from 148 in 09/2022, HDL back to WNL at 52 from 46.  Diabetes Mellitus, type II treated with 500 mg Metformin BID. 03/14/2023 HgbA1c 6.8, elevated from 6.6 in 09/2022 - she has remained consistently within 6%, with her highest being 6.9 in March 2024.    Reviewed 03/14/2023 Hepatic Function Panel: WNL.   mild Chronic Kidney Disease 09/2022 Microalb/Creat WNL - per patient request ordering BMET & Microalb/Creat  BMI is 36.33 today.    GERD treated with Prilosec 40 mg daily.  Mammogram 01/2023, which was normal with recommended repeat of 2026.    I,Leah Barron,acting as a Neurosurgeon for Margaree Mackintosh, MD.,have documented all relevant documentation on the behalf of Margaree Mackintosh, MD,as directed by  Margaree Mackintosh, MD while in  the presence of Margaree Mackintosh, MD.   Marland Kitchen

## 2023-03-14 ENCOUNTER — Other Ambulatory Visit: Payer: Medicare Other

## 2023-03-14 DIAGNOSIS — E119 Type 2 diabetes mellitus without complications: Secondary | ICD-10-CM | POA: Diagnosis not present

## 2023-03-14 DIAGNOSIS — E1169 Type 2 diabetes mellitus with other specified complication: Secondary | ICD-10-CM

## 2023-03-14 DIAGNOSIS — I10 Essential (primary) hypertension: Secondary | ICD-10-CM

## 2023-03-14 DIAGNOSIS — E785 Hyperlipidemia, unspecified: Secondary | ICD-10-CM | POA: Diagnosis not present

## 2023-03-15 LAB — HEMOGLOBIN A1C
Hgb A1c MFr Bld: 6.8 %{Hb} — ABNORMAL HIGH (ref <5.7)
Mean Plasma Glucose: 148 mg/dL
eAG (mmol/L): 8.2 mmol/L

## 2023-03-15 LAB — HEPATIC FUNCTION PANEL
AG Ratio: 1.7 (calc) (ref 1.0–2.5)
ALT: 10 U/L (ref 6–29)
AST: 12 U/L (ref 10–35)
Albumin: 4.4 g/dL (ref 3.6–5.1)
Alkaline phosphatase (APISO): 68 U/L (ref 37–153)
Bilirubin, Direct: 0.1 mg/dL (ref 0.0–0.2)
Globulin: 2.6 g/dL (ref 1.9–3.7)
Indirect Bilirubin: 0.4 mg/dL (ref 0.2–1.2)
Total Bilirubin: 0.5 mg/dL (ref 0.2–1.2)
Total Protein: 7 g/dL (ref 6.1–8.1)

## 2023-03-15 LAB — LIPID PANEL
Cholesterol: 137 mg/dL (ref <200)
HDL: 52 mg/dL (ref >=50)
LDL Cholesterol (Calc): 61 mg/dL
Non-HDL Cholesterol (Calc): 85 mg/dL (ref <130)
Total CHOL/HDL Ratio: 2.6 (calc) (ref <5.0)
Triglycerides: 159 mg/dL — ABNORMAL HIGH (ref <150)

## 2023-03-16 ENCOUNTER — Ambulatory Visit: Payer: Medicare Other | Admitting: Internal Medicine

## 2023-03-16 ENCOUNTER — Encounter: Payer: Self-pay | Admitting: Internal Medicine

## 2023-03-16 VITALS — BP 130/70 | HR 60 | Temp 98.2°F | Ht 60.0 in | Wt 186.0 lb

## 2023-03-16 DIAGNOSIS — E785 Hyperlipidemia, unspecified: Secondary | ICD-10-CM | POA: Diagnosis not present

## 2023-03-16 DIAGNOSIS — E1169 Type 2 diabetes mellitus with other specified complication: Secondary | ICD-10-CM | POA: Diagnosis not present

## 2023-03-16 DIAGNOSIS — Z6836 Body mass index (BMI) 36.0-36.9, adult: Secondary | ICD-10-CM | POA: Diagnosis not present

## 2023-03-16 DIAGNOSIS — Z8659 Personal history of other mental and behavioral disorders: Secondary | ICD-10-CM

## 2023-03-16 DIAGNOSIS — I10 Essential (primary) hypertension: Secondary | ICD-10-CM

## 2023-03-17 ENCOUNTER — Encounter: Payer: Self-pay | Admitting: Internal Medicine

## 2023-03-17 LAB — BASIC METABOLIC PANEL
BUN/Creatinine Ratio: 18 (calc) (ref 6–22)
BUN: 21 mg/dL (ref 7–25)
CO2: 32 mmol/L (ref 20–32)
Calcium: 9.8 mg/dL (ref 8.6–10.4)
Chloride: 100 mmol/L (ref 98–110)
Creat: 1.19 mg/dL — ABNORMAL HIGH (ref 0.60–0.95)
Glucose, Bld: 146 mg/dL — ABNORMAL HIGH (ref 65–99)
Potassium: 3.5 mmol/L (ref 3.5–5.3)
Sodium: 139 mmol/L (ref 135–146)

## 2023-03-17 LAB — MICROALBUMIN / CREATININE URINE RATIO
Creatinine, Urine: 11 mg/dL — ABNORMAL LOW (ref 20–275)
Microalb, Ur: <0.2 mg/dL

## 2023-03-17 NOTE — Patient Instructions (Signed)
 It was a pleasure to see you today.  Continue current medications and follow-up in 6 months for Medicare wellness visit or as needed.

## 2023-05-06 ENCOUNTER — Other Ambulatory Visit: Payer: Self-pay | Admitting: Internal Medicine

## 2023-05-23 DIAGNOSIS — H52223 Regular astigmatism, bilateral: Secondary | ICD-10-CM | POA: Diagnosis not present

## 2023-05-23 DIAGNOSIS — H04123 Dry eye syndrome of bilateral lacrimal glands: Secondary | ICD-10-CM | POA: Diagnosis not present

## 2023-05-23 DIAGNOSIS — Z9841 Cataract extraction status, right eye: Secondary | ICD-10-CM | POA: Diagnosis not present

## 2023-05-23 DIAGNOSIS — E119 Type 2 diabetes mellitus without complications: Secondary | ICD-10-CM | POA: Diagnosis not present

## 2023-05-23 DIAGNOSIS — H40013 Open angle with borderline findings, low risk, bilateral: Secondary | ICD-10-CM | POA: Diagnosis not present

## 2023-05-23 DIAGNOSIS — Z9842 Cataract extraction status, left eye: Secondary | ICD-10-CM | POA: Diagnosis not present

## 2023-05-23 LAB — HM DIABETES EYE EXAM

## 2023-05-31 ENCOUNTER — Other Ambulatory Visit: Payer: Self-pay | Admitting: Internal Medicine

## 2023-06-26 ENCOUNTER — Other Ambulatory Visit: Payer: Self-pay | Admitting: Internal Medicine

## 2023-06-26 NOTE — Telephone Encounter (Signed)
 Medication: Xanax  Directions:  TAKE 1 TABLET BY MOUTH TWICE A DAY  Last given: 02/01/2023 Number refills: 3 Last o/v: 03/16/2023 Follow up:  Return in about 6 months (around 09/16/2023) Labs: 03/16/2023

## 2023-07-14 DIAGNOSIS — H6121 Impacted cerumen, right ear: Secondary | ICD-10-CM | POA: Diagnosis not present

## 2023-07-15 ENCOUNTER — Other Ambulatory Visit: Payer: Self-pay | Admitting: Internal Medicine

## 2023-08-09 ENCOUNTER — Other Ambulatory Visit: Payer: Self-pay | Admitting: Internal Medicine

## 2023-08-18 ENCOUNTER — Other Ambulatory Visit: Payer: Self-pay | Admitting: Internal Medicine

## 2023-08-18 DIAGNOSIS — E119 Type 2 diabetes mellitus without complications: Secondary | ICD-10-CM

## 2023-09-18 NOTE — Patient Instructions (Incomplete)
 Next appointment: Follow up in one year for your annual wellness visit    Preventive Care 65 Years and Older, Female Preventive care refers to lifestyle choices and visits with your health care provider that can promote health and wellness. What does preventive care include? A yearly physical exam. This is also called an annual well check. Dental exams once or twice a year. Routine eye exams. Ask your health care provider how often you should have your eyes checked. Personal lifestyle choices, including: Daily care of your teeth and gums. Regular physical activity. Eating a healthy diet. Avoiding tobacco and drug use. Limiting alcohol use. Practicing safe sex. Taking low-dose aspirin every day. Taking vitamin and mineral supplements as recommended by your health care provider. What happens during an annual well check? The services and screenings done by your health care provider during your annual well check will depend on your age, overall health, lifestyle risk factors, and family history of disease. Counseling  Your health care provider may ask you questions about your: Alcohol use. Tobacco use. Drug use. Emotional well-being. Home and relationship well-being. Sexual activity. Eating habits. History of falls. Memory and ability to understand (cognition). Work and work Astronomer. Reproductive health. Screening  You may have the following tests or measurements: Height, weight, and BMI. Blood pressure. Lipid and cholesterol levels. These may be checked every 5 years, or more frequently if you are over 83 years old. Skin check. Lung cancer screening. You may have this screening every year starting at age 83 if you have a 30-pack-year history of smoking and currently smoke or have quit within the past 15 years. Fecal occult blood test (FOBT) of the stool. You may have this test every year starting at age 83. Flexible sigmoidoscopy or colonoscopy. You may have a  sigmoidoscopy every 5 years or a colonoscopy every 10 years starting at age 58. Hepatitis C blood test. Hepatitis B blood test. Sexually transmitted disease (STD) testing. Diabetes screening. This is done by checking your blood sugar (glucose) after you have not eaten for a while (fasting). You may have this done every 1-3 years. Bone density scan. This is done to screen for osteoporosis. You may have this done starting at age 83. Mammogram. This may be done every 1-2 years. Talk to your health care provider about how often you should have regular mammograms. Talk with your health care provider about your test results, treatment options, and if necessary, the need for more tests. Vaccines  Your health care provider may recommend certain vaccines, such as: Influenza vaccine. This is recommended every year. Tetanus, diphtheria, and acellular pertussis (Tdap, Td) vaccine. You may need a Td booster every 10 years. Zoster vaccine. You may need this after age 83. Pneumococcal 13-valent conjugate (PCV13) vaccine. One dose is recommended after age 83. Pneumococcal polysaccharide (PPSV23) vaccine. One dose is recommended after age 83. Talk to your health care provider about which screenings and vaccines you need and how often you need them. This information is not intended to replace advice given to you by your health care provider. Make sure you discuss any questions you have with your health care provider. Document Released: 01/16/2015 Document Revised: 09/09/2015 Document Reviewed: 10/21/2014 Elsevier Interactive Patient Education  2017 [employer].  Fall Prevention in the Home Falls can cause injuries. They can happen to people of all ages. There are many things you can do to make your home safe and to help prevent falls. What can I do on the outside of  my home? Regularly fix the edges of walkways and driveways and fix any cracks. Remove anything that might make you trip as you walk through a  door, such as a raised step or threshold. Trim any bushes or trees on the path to your home. Use bright outdoor lighting. Clear any walking paths of anything that might make someone trip, such as rocks or tools. Regularly check to see if handrails are loose or broken. Make sure that both sides of any steps have handrails. Any raised decks and porches should have guardrails on the edges. Have any leaves, snow, or ice cleared regularly. Use sand or salt on walking paths during winter. Clean up any spills in your garage right away. This includes oil or grease spills. What can I do in the bathroom? Use night lights. Install grab bars by the toilet and in the tub and shower. Do not use towel bars as grab bars. Use non-skid mats or decals in the tub or shower. If you need to sit down in the shower, use a plastic, non-slip stool. Keep the floor dry. Clean up any water that spills on the floor as soon as it happens. Remove soap buildup in the tub or shower regularly. Attach bath mats securely with double-sided non-slip rug tape. Do not have throw rugs and other things on the floor that can make you trip. What can I do in the bedroom? Use night lights. Make sure that you have a light by your bed that is easy to reach. Do not use any sheets or blankets that are too big for your bed. They should not hang down onto the floor. Have a firm chair that has side arms. You can use this for support while you get dressed. Do not have throw rugs and other things on the floor that can make you trip. What can I do in the kitchen? Clean up any spills right away. Avoid walking on wet floors. Keep items that you use a lot in easy-to-reach places. If you need to reach something above you, use a strong step stool that has a grab bar. Keep electrical cords out of the way. Do not use floor polish or wax that makes floors slippery. If you must use wax, use non-skid floor wax. Do not have throw rugs and other things  on the floor that can make you trip. What can I do with my stairs? Do not leave any items on the stairs. Make sure that there are handrails on both sides of the stairs and use them. Fix handrails that are broken or loose. Make sure that handrails are as long as the stairways. Check any carpeting to make sure that it is firmly attached to the stairs. Fix any carpet that is loose or worn. Avoid having throw rugs at the top or bottom of the stairs. If you do have throw rugs, attach them to the floor with carpet tape. Make sure that you have a light switch at the top of the stairs and the bottom of the stairs. If you do not have them, ask someone to add them for you. What else can I do to help prevent falls? Wear shoes that: Do not have high heels. Have rubber bottoms. Are comfortable and fit you well. Are closed at the toe. Do not wear sandals. If you use a stepladder: Make sure that it is fully opened. Do not climb a closed stepladder. Make sure that both sides of the stepladder are locked into place. Ask  someone to hold it for you, if possible. Clearly mark and make sure that you can see: Any grab bars or handrails. First and last steps. Where the edge of each step is. Use tools that help you move around (mobility aids) if they are needed. These include: Canes. Walkers. Scooters. Crutches. Turn on the lights when you go into a dark area. Replace any light bulbs as soon as they burn out. Set up your furniture so you have a clear path. Avoid moving your furniture around. If any of your floors are uneven, fix them. If there are any pets around you, be aware of where they are. Review your medicines with your doctor. Some medicines can make you feel dizzy. This can increase your chance of falling. Ask your doctor what other things that you can do to help prevent falls. This information is not intended to replace advice given to you by your health care provider. Make sure you discuss any  questions you have with your health care provider. Document Released: 10/16/2008 Document Revised: 05/28/2015 Document Reviewed: 01/24/2014 Elsevier Interactive Patient Education  2017 [employer].

## 2023-09-19 ENCOUNTER — Other Ambulatory Visit

## 2023-09-19 DIAGNOSIS — I10 Essential (primary) hypertension: Secondary | ICD-10-CM | POA: Diagnosis not present

## 2023-09-19 DIAGNOSIS — E1169 Type 2 diabetes mellitus with other specified complication: Secondary | ICD-10-CM

## 2023-09-19 DIAGNOSIS — K219 Gastro-esophageal reflux disease without esophagitis: Secondary | ICD-10-CM | POA: Diagnosis not present

## 2023-09-19 DIAGNOSIS — Z0000 Encounter for general adult medical examination without abnormal findings: Secondary | ICD-10-CM

## 2023-09-19 DIAGNOSIS — E119 Type 2 diabetes mellitus without complications: Secondary | ICD-10-CM

## 2023-09-19 DIAGNOSIS — E785 Hyperlipidemia, unspecified: Secondary | ICD-10-CM | POA: Diagnosis not present

## 2023-09-19 NOTE — Progress Notes (Signed)
 Annual Wellness Visit   Patient Care Team: Perri Ronal PARAS, MD as PCP - General (Internal Medicine)  Visit Date: 09/21/23   Chief Complaint  Patient presents with   Medicare Wellness   Annual Exam   Subjective:  Patient: Leah Barron, Female DOB: [birth year], 83 y.o. MRN: [id number] Vitals:   09/21/23 1050  BP: 128/70   DAILIN Barron is a 83 y.o. Female who presents today for her annual health maintenance exam and evaluation of medical issues.     Recently went to ENT for cerumen extraction and discussed using Hearing aids but declined getting them.    History of Hypertension treated with Amlodipine  5 mg daily, Metoprolol  tartrate 25 mg, Hydrochlorothiazide  25 mg daily, Olmesartan  40 mg daily. Blood pressure today is normal at 128/70.    History of Hyperlipidemia treated with Atorvastatin  20 mg daily. 09/19/2023 Lipid Panel. CHOL 145, HDL 55, Triglycerides 167, LDL 65. Triglycerides up from 03/14/2023 Triglycerides 159.  History of Anxiety treated with Alprazolam  0.5 mg twice daily.    History of GERD treated with Omeprazole  40 mg daily.   History of Edema treated with Furosemide  20 mg daily.   History of Diabetes Mellitus, Type II Treated with Metformin  500 mg twice daily with meals. 09/19/2023 HgbA1c 6.4% decreased from 03/14/2023 HgbA1c 6.8%. 09/19/2023 Blood glucose 124 decreased from 03/19/23 blood glucose 146.   EFY:Ejupzwu has had cholecystectomy in 1998, hysterectomy without oophorectomy 1991, left breast biopsy 1978, bladder biopsies for microscopic hematuria in 1988 and 1999 both of which were benign.  In 1996 she suffered a left ankle injury secondary to a fall. 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 History of adenomatous colon polyps and obesity. Remote history of microscopic hematuria previously evaluated by urologist.    She is intolerant of Codeine and Parafon Forte.       01/09/2023 Mammogram No mammographic evidence of malignancy. Repeat in one year.     01/06/2022 Bone density Based on WHO criteria the patient has a bone density within normal limits.    09/28/2020 Colonoscopy Impression preparation in the colon was fair. Diverticulosis in the Recto-sigmoid colon in the sigmoid colon in the transverse colon in the descending colon and the hepatic fixture. 11.3 to 5 mm polyps in the transverse colon, in the cecum and in the ileocecal valve. Pathology notable for tubular adenoma and sessile serrated adenoma. Stool in the entire examined colon. Otherwise exam was normal.   Labs 09/19/2023 HgbA1c 6.4, Triglycerides 167, Blood glucose 124, Creatine 1.15, eGFR 47, Kidney functions normal.    Vaccine counseling: Influenza vaccine received today.   Health Maintenance  Topic Date Due   Colonoscopy  01/20/2023   Mammogram  01/09/2024   FOOT EXAM  03/15/2024   HEMOGLOBIN A1C  03/18/2024   OPHTHALMOLOGY EXAM  05/22/2024   Diabetic kidney evaluation - eGFR measurement  09/18/2024   Diabetic kidney evaluation - Urine ACR  09/18/2024   Medicare Annual Wellness (AWV)  09/20/2024   DTaP/Tdap/Td (3 - Td or Tdap) 11/04/2031   Pneumococcal Vaccine: 50+ Years  Completed   Influenza Vaccine  Completed   DEXA SCAN  Completed   Zoster Vaccines- Shingrix  Completed   HPV VACCINES  Aged Out   Meningococcal B Vaccine  Aged Out   COVID-19 Vaccine  Discontinued     Review of Systems  Constitutional:  Negative for fever and malaise/fatigue.  HENT:  Positive for hearing loss.  Negative for congestion.   Eyes:  Negative for blurred vision.  Respiratory:  Negative for cough and shortness of breath.   Cardiovascular:  Negative for chest pain, palpitations and leg swelling.  Gastrointestinal:  Negative for vomiting.  Musculoskeletal:  Negative for back pain.  Skin:  Negative for rash.  Neurological:  Negative for loss of consciousness and headaches.   Objective:  Vitals:  body mass index is 34.48 kg/m. Today's Vitals   09/21/23 1050 09/21/23 1137  BP: 128/70   Pulse: (!) 52   SpO2: 98%   Weight: 178 lb (80.7 kg)   Height: 5' 0.25 (1.53 m)   PainSc: 0-No pain 0-No pain   Physical Exam Vitals and nursing note reviewed. Exam conducted with a chaperone present Gae Govan, CMA).  Constitutional:      General: She is not in acute distress.    Appearance: Normal appearance. She is not ill-appearing or toxic-appearing.  HENT:     Head: Normocephalic and atraumatic.     Right Ear: Hearing, tympanic membrane, ear canal and external ear normal.     Left Ear: Hearing, tympanic membrane, ear canal and external ear normal.     Mouth/Throat:     Pharynx: Oropharynx is clear.  Eyes:     Extraocular Movements: Extraocular movements intact.     Pupils: Pupils are equal, round, and reactive to light.  Neck:     Thyroid : No thyroid  mass, thyromegaly or thyroid  tenderness.     Vascular: No carotid bruit.  Cardiovascular:     Rate and Rhythm: Normal rate and regular rhythm. No extrasystoles are present.    Pulses:          Dorsalis pedis pulses are 2+ on the right side and 2+ on the left side.       Posterior tibial pulses are 1+ on the right side and 1+ on the left side.     Heart sounds: Normal heart sounds. No murmur heard.    No friction rub. No gallop.  Pulmonary:     Effort: Pulmonary effort is normal.     Breath sounds: Normal breath sounds. No decreased breath sounds, wheezing, rhonchi or rales.  Chest:     Chest wall: No mass.  Breasts:    Right: No mass or nipple discharge.     Left: No mass or nipple discharge.  Abdominal:     Palpations: Abdomen is soft. There is no hepatomegaly, splenomegaly or mass.     Tenderness: There is no abdominal tenderness.     Hernia: No hernia is present.  Genitourinary:    Comments: Bimanual exam normal.  Musculoskeletal:     Cervical back: Normal range of motion.     Right lower leg: Edema present.      Left lower leg: Edema present.     Comments: Trace Lower extremity Edema  Lymphadenopathy:     Cervical: No cervical adenopathy.     Upper Body:     Right upper body: No supraclavicular adenopathy.     Left upper body: No supraclavicular adenopathy.  Skin:    General: Skin is warm and dry.  Neurological:     General: No focal deficit present.     Mental Status: She is alert and oriented to person, place, and time. Mental status is at baseline.     Sensory: Sensation is intact.     Motor: Motor function is intact. No weakness.     Deep Tendon Reflexes: Reflexes are normal and symmetric.  Psychiatric:        Attention and Perception: Attention normal.        Mood and Affect: Mood normal.        Speech: Speech normal.        Behavior: Behavior normal.        Thought Content: Thought content normal.        Cognition and Memory: Cognition normal.        Judgment: Judgment normal.     Current Outpatient Medications  Medication Instructions   ACCU-CHEK GUIDE TEST test strip Daily, for testing   Accu-Chek Softclix Lancets lancets Daily, for testing   ALPRAZolam  (XANAX ) 0.5 mg, Oral, 2 times daily   amLODipine  (NORVASC ) 5 mg, Oral, Daily   aspirin 81 mg, Daily   atorvastatin  (LIPITOR) 20 mg, Oral, Daily   Blood Glucose Calibration (ACCU-CHEK AVIVA) SOLN USE AS DIRECTED   Blood Glucose Monitoring Suppl (ACCU-CHEK GUIDE ME) w/Device KIT CHECK BLOOD SUGAR ONCE  DAILY   Cholecalciferol 2,000 Units, 1 Day/Dose   Cyanocobalamin (VITAMIN B 12 PO) Take by mouth.   furosemide  (LASIX ) 20 mg, Oral, Daily   hydrochlorothiazide  (HYDRODIURIL ) 25 mg, Oral, Daily   Lancet Devices (AUTOLET IMPRESSION) MISC As directed   Lancets Misc. (ACCU-CHEK SOFTCLIX LANCET DEV) KIT USE ONCE DAILY FOR GLUCOSE  TESTING   metFORMIN  (GLUCOPHAGE -XR) 500 mg, Oral, 2 times daily with meals   metoprolol  tartrate (LOPRESSOR ) 25 mg, Oral, Daily   Multiple Vitamin (MULTIVITAMIN) tablet 1 tablet, Daily   olmesartan   (BENICAR ) 40 mg, Oral, Daily   Omega-3 Fatty Acids (FISH OIL) 1000 MG CAPS Take by mouth.   omeprazole  (PRILOSEC) 40 mg, Oral, Daily   rOPINIRole  (REQUIP ) 0.5 mg, Oral, Daily at bedtime   Past Medical History:  Diagnosis Date   Allergy    Anxiety    Cataract    Diabetes mellitus    GERD (gastroesophageal reflux disease)    Hematuria, microscopic    History of recurrent UTIs 05/11/2010   Hyperlipidemia    Positive TB test    Vitamin D  deficiency    Medical/Surgical History Narrative:  Allergic/Intolerant to:  Allergies  Allergen Reactions   Chlorzoxazone     Unknown reaction   Codeine Nausea Only    Past Surgical History:  Procedure Laterality Date   ABDOMINAL HYSTERECTOMY     BREAST SURGERY  1978   l breast biopsy   CHOLECYSTECTOMY     CYSTOSTOMY W/ BLADDER BIOPSY  1984, 1989   Family History  Problem Relation Age of Onset   Dementia Mother    Diabetes Father    Diabetes Sister    Heart disease Sister    Diabetes Brother    Kidney disease Brother    HIV Daughter     Family history: Large family: 6 brothers total.  1 brother with history of hypertension and kidney failure.  1 brother with history of diabetes, coronary artery disease and mental illness.  1 brother died of lung cancer.  1 sister died with diabetes mellitus, heart failure and kidney failure.  1 sister died of cardiac arrest.  1 daughter, [name] died of cancer during the pandemic.   Social history: She is a widow.  Husband died from complications of cirrhosis of the liver a number of years ago.  1 daughter living with history of HIV doing well.  1 daughter with history of mental illness.  Patient does not smoke or consume alcohol.  She has a grandson.  She is a Production assistant, radio.  Lives with daughter, grandson Christopher, and son-in-law who is a Optician, dispensing currently. Still works at [employer] just not as often.      Most Recent Health Risks Assessment:   Medicare Risk at Home - 09/21/23 1051     Any stairs in or  around the home? No    If so, are there any without handrails? No    Home free of loose throw rugs in walkways, pet beds, electrical cords, etc? Yes    Adequate lighting in your home to reduce risk of falls? Yes    Life alert? No    Use of a cane, walker or w/c? No    Grab bars in the bathroom? Yes    Shower chair or bench in shower? No    Elevated toilet seat or a handicapped toilet? No         Most Recent Social Determinants of Health (Including Hx of Tobacco, Alcohol, and Drug Use) SDOH Screenings   Food Insecurity: No Food Insecurity (09/21/2023)  Housing: Low Risk  (09/21/2023)  Transportation Needs: No Transportation Needs (09/21/2023)  Utilities: Not At Risk (09/21/2023)  Alcohol Screen: Low Risk  (09/21/2023)  Depression (PHQ2-9): Low Risk  (09/21/2023)  Financial Resource Strain: Low Risk  (09/21/2023)  Physical Activity: Insufficiently Active (09/21/2023)  Social Connections: Moderately Integrated (09/21/2023)  Stress: No Stress Concern Present (09/21/2023)  Tobacco Use: Low Risk  (09/21/2023)  Health Literacy: Adequate Health Literacy (09/21/2023)   Social History   Tobacco Use   Smoking status: Never   Smokeless tobacco: Never  Substance Use Topics   Alcohol use: No   Drug use: No   Most Recent Functional Status Assessment:    09/21/2023   10:50 AM  In your present state of health, do you have any difficulty performing the following activities:  Hearing? 0  Vision? 0  Difficulty concentrating or making decisions? 0  Walking or climbing stairs? 0  Dressing or bathing? 0  Doing errands, shopping? 0  Preparing Food and eating ? N  Using the Toilet? N  In the past six months, have you accidently leaked urine? N  Do you have problems with loss of bowel control? N  Managing your Medications? N  Managing your Finances? N  Housekeeping or managing your Housekeeping? N   Most Recent Fall Risk Assessment:    09/21/2023   10:52 AM  Fall Risk   Falls in the past year?  1  Number falls in past yr: 0  Risk for fall due to : Other (Comment)  Follow up Falls evaluation completed;Education provided;Falls prevention discussed   Most Recent Anxiety/Depression Screenings:    09/21/2023   10:53 AM 09/15/2022   10:52 AM  PHQ 2/9 Scores  PHQ - 2 Score 0 0       No data to display         Most Recent Cognitive Screening:    09/21/2023   10:51 AM  6CIT Screen  What Year? 0 points  What month? 0 points  What time? 0 points  Count back from 20 0 points  Months in reverse 0 points  Repeat phrase 0 points  Total Score 0 points   Most Recent Vision/Hearing Screenings:No results found. Results:  Studies Obtained And Personally Reviewed By Me:    01/09/2023 Mammogram No mammographic evidence of malignancy. Repeat in one year.     01/06/2022 Bone density Based on WHO criteria the patient has a bone density within normal limits    09/28/2020  Colonoscopy Impression preparation in the colon was fair. Diverticulosis in the Recto-sigmoid colon in the sigmoid colon in the transverse colon in the descending colon and the hepatic fixture. 11.3 to 5 mm polyps in the transverse colon, in the cecum and in the ileocecal valve. Pathology notable for tubular adenoma and sessile serrated adenoma. Stool in the entire examined colon. Otherwise exam was normal.   Labs:  CBC w/ Differential Lab Results  Component Value Date   WBC 6.0 09/19/2023   RBC 3.80 09/19/2023   HGB 11.9 09/19/2023   HCT 36.7 09/19/2023   PLT 286 09/19/2023   MCV 96.6 09/19/2023   MCH 31.3 09/19/2023   MCHC 32.4 09/19/2023   RDW 13.0 09/19/2023   MPV 9.2 09/19/2023   LYMPHSABS 2,342 09/13/2022   MONOABS 350 07/20/2015   BASOSABS 18 09/19/2023    Comprehensive Metabolic Panel Lab Results  Component Value Date   NA 141 09/19/2023   K 3.6 09/19/2023   CL 102 09/19/2023   CO2 31 09/19/2023   GLUCOSE 124 (H) 09/19/2023   BUN 20 09/19/2023   CREATININE 1.15 (H) 09/19/2023   CALCIUM  9.5  09/19/2023   PROT 6.7 09/19/2023   ALBUMIN 4.2 01/19/2016   AST 11 09/19/2023   ALT 8 09/19/2023   ALKPHOS 73 01/19/2016   BILITOT 0.4 09/19/2023   EGFR 47 (L) 09/19/2023   GFRNONAA 55 (L) 08/09/2019   Lipid Panel  Lab Results  Component Value Date   CHOL 145 09/19/2023   HDL 55 09/19/2023   LDLCALC 65 09/19/2023   TRIG 167 (H) 09/19/2023   A1c Lab Results  Component Value Date   HGBA1C 6.4 (H) 09/19/2023    TSH Lab Results  Component Value Date   TSH 0.94 09/19/2023    Assessment & Plan:   Orders Placed This Encounter  Procedures   Flu vaccine HIGH DOSE PF(Fluzone Trivalent)   POCT URINALYSIS DIP (CLINITEK)  Healthcare maintenance- flu vaccine given  Hyperlipidemia treated with atorvastatin  20 mg daily. 09/19/2023 Lipid Panel. CHOL 145, HDL 55, Triglycerides 167, LDL 65. Triglycerides up from 03/14/2023 Triglycerides 159  Anxiety treated with alprazolam  0.5 mg twice daily.    GE Reflux treated with omeprazole  40 mg daily.    Lower extremity Edema treated with furosemide  20 mg daily.   Diabetes Mellitus, Type II: Treated with Metformin  500 mg twice daily with meals. 09/19/2023 HgbA1c 6.4% decreased from 03/14/2023 HgbA1c 6.8%. 09/19/2023 Blood glucose 124 decreased from 03/19/23 blood glucose 146  01/09/2023 Mammogram No mammographic evidence of malignancy. Repeat in one year.     01/06/2022 Bone density Based on WHO criteria the patient has a bone density within normal limits    09/28/2020 Colonoscopy Impression preparation in the colon was fair. Diverticulosis in the Recto-sigmoid colon in the sigmoid colon in the transverse colon in the descending colon and the hepatic fixture. 11.3 to 5 mm polyps in the transverse colon, in the cecum and in the ileocecal valve. Pathology notable for tubular adenoma and sessile serrated adenoma. Stool in the entire examined colon. Otherwise exam was normal.     Vaccine counseling: Influenza vaccine received today.   Plan:  Continue current meds and RTC in 6 months for 6 month follow up with [facility] and lipid panel done prior to next visit. Flu vaccine given today.  Return 03/19/2024 for lab visit and 03/21/2024 for office visit.   Annual Wellness Visit done today including the all of the following: Reviewed patient's Family Medical History Reviewed patient's  SDOH and reviewed tobacco, alcohol, and drug use.  Reviewed and updated list of patient's medical providers Assessment of cognitive impairment was done Assessed patient's functional ability Established a written schedule for health screening services Health Risk Assessent Completed and Reviewed  Discussed health benefits of physical activity, and encouraged her to engage in regular exercise appropriate for her age and condition.    I,Makayla C Reid,acting as a scribe for Ronal JINNY Hailstone, MD.,have documented all relevant documentation on the behalf of Ronal JINNY Hailstone, MD,as directed by  Ronal JINNY Hailstone, MD while in the presence of Ronal JINNY Hailstone, MD.   I, Ronal JINNY Hailstone, MD, have reviewed all documentation for and agree with the above Annual Wellness Visit documentation.  Ronal JINNY Hailstone, MD Internal Medicine 09/21/2023

## 2023-09-20 LAB — CBC WITH DIFFERENTIAL/PLATELET
Absolute Lymphocytes: 2160 {cells}/uL (ref 850–3900)
Absolute Monocytes: 474 {cells}/uL (ref 200–950)
Basophils Absolute: 18 {cells}/uL (ref 0–200)
Basophils Relative: 0.3 %
Eosinophils Absolute: 72 {cells}/uL (ref 15–500)
Eosinophils Relative: 1.2 %
HCT: 36.7 % (ref 35.0–45.0)
Hemoglobin: 11.9 g/dL (ref 11.7–15.5)
MCH: 31.3 pg (ref 27.0–33.0)
MCHC: 32.4 g/dL (ref 32.0–36.0)
MCV: 96.6 fL (ref 80.0–100.0)
MPV: 9.2 fL (ref 7.5–12.5)
Monocytes Relative: 7.9 %
Neutro Abs: 3276 {cells}/uL (ref 1500–7800)
Neutrophils Relative %: 54.6 %
Platelets: 286 Thousand/uL (ref 140–400)
RBC: 3.8 Million/uL (ref 3.80–5.10)
RDW: 13 % (ref 11.0–15.0)
Total Lymphocyte: 36 %
WBC: 6 Thousand/uL (ref 3.8–10.8)

## 2023-09-20 LAB — COMPREHENSIVE METABOLIC PANEL WITH GFR
AG Ratio: 1.7 (calc) (ref 1.0–2.5)
ALT: 8 U/L (ref 6–29)
AST: 11 U/L (ref 10–35)
Albumin: 4.2 g/dL (ref 3.6–5.1)
Alkaline phosphatase (APISO): 65 U/L (ref 37–153)
BUN/Creatinine Ratio: 17 (calc) (ref 6–22)
BUN: 20 mg/dL (ref 7–25)
CO2: 31 mmol/L (ref 20–32)
Calcium: 9.5 mg/dL (ref 8.6–10.4)
Chloride: 102 mmol/L (ref 98–110)
Creat: 1.15 mg/dL — ABNORMAL HIGH (ref 0.60–0.95)
Globulin: 2.5 g/dL (ref 1.9–3.7)
Glucose, Bld: 124 mg/dL — ABNORMAL HIGH (ref 65–99)
Potassium: 3.6 mmol/L (ref 3.5–5.3)
Sodium: 141 mmol/L (ref 135–146)
Total Bilirubin: 0.4 mg/dL (ref 0.2–1.2)
Total Protein: 6.7 g/dL (ref 6.1–8.1)
eGFR: 47 mL/min/1.73m2 — ABNORMAL LOW (ref >=60)

## 2023-09-20 LAB — HEMOGLOBIN A1C
Hgb A1c MFr Bld: 6.4 % — ABNORMAL HIGH (ref <5.7)
Mean Plasma Glucose: 137 mg/dL
eAG (mmol/L): 7.6 mmol/L

## 2023-09-20 LAB — LIPID PANEL
Cholesterol: 145 mg/dL (ref <200)
HDL: 55 mg/dL (ref >=50)
LDL Cholesterol (Calc): 65 mg/dL
Non-HDL Cholesterol (Calc): 90 mg/dL (ref <130)
Total CHOL/HDL Ratio: 2.6 (calc) (ref <5.0)
Triglycerides: 167 mg/dL — ABNORMAL HIGH (ref <150)

## 2023-09-20 LAB — MICROALBUMIN / CREATININE URINE RATIO
Creatinine, Urine: 25 mg/dL (ref 20–275)
Microalb, Ur: <0.2 mg/dL

## 2023-09-20 LAB — TSH: TSH: 0.94 m[IU]/L (ref 0.40–4.50)

## 2023-09-21 ENCOUNTER — Ambulatory Visit (INDEPENDENT_AMBULATORY_CARE_PROVIDER_SITE_OTHER): Admitting: Internal Medicine

## 2023-09-21 ENCOUNTER — Encounter: Payer: Self-pay | Admitting: Internal Medicine

## 2023-09-21 VITALS — BP 128/70 | HR 52 | Ht 60.25 in | Wt 178.0 lb

## 2023-09-21 DIAGNOSIS — K219 Gastro-esophageal reflux disease without esophagitis: Secondary | ICD-10-CM

## 2023-09-21 DIAGNOSIS — Z7984 Long term (current) use of oral hypoglycemic drugs: Secondary | ICD-10-CM

## 2023-09-21 DIAGNOSIS — Z0000 Encounter for general adult medical examination without abnormal findings: Secondary | ICD-10-CM

## 2023-09-21 DIAGNOSIS — E1169 Type 2 diabetes mellitus with other specified complication: Secondary | ICD-10-CM | POA: Diagnosis not present

## 2023-09-21 DIAGNOSIS — I10 Essential (primary) hypertension: Secondary | ICD-10-CM | POA: Diagnosis not present

## 2023-09-21 DIAGNOSIS — Z23 Encounter for immunization: Secondary | ICD-10-CM | POA: Diagnosis not present

## 2023-09-21 DIAGNOSIS — Z8659 Personal history of other mental and behavioral disorders: Secondary | ICD-10-CM

## 2023-09-21 DIAGNOSIS — E785 Hyperlipidemia, unspecified: Secondary | ICD-10-CM

## 2023-09-21 LAB — POCT URINALYSIS DIP (CLINITEK)
Bilirubin, UA: NEGATIVE
Blood, UA: NEGATIVE
Glucose, UA: NEGATIVE mg/dL
Ketones, POC UA: NEGATIVE mg/dL
Leukocytes, UA: NEGATIVE
Nitrite, UA: NEGATIVE
POC PROTEIN,UA: NEGATIVE
Spec Grav, UA: 1.01 (ref 1.010–1.025)
Urobilinogen, UA: 0.2 U/dL
pH, UA: 6.5 (ref 5.0–8.0)

## 2023-09-21 NOTE — Progress Notes (Unsigned)
 Subjective:   Leah Barron is a 83 y.o. female who presents for Medicare Annual (Subsequent) preventive examination.  Visit Complete: In person  Patient Medicare AWV questionnaire was completed by the patient on 09/21/2023; I have confirmed that all information answered by patient is correct and no changes since this date.  Cardiac Risk Factors include: advanced age (>74men, >56 women);diabetes mellitus;hypertension;obesity (BMI >30kg/m2)     Objective:    Today's Vitals   09/21/23 1050 09/21/23 1137  BP: 128/70   Pulse: (!) 52   SpO2: 98%   Weight: 178 lb (80.7 kg)   Height: 5' 0.25 (1.53 m)   PainSc: 0-No pain 0-No pain   Body mass index is 34.48 kg/m.     09/21/2023   11:38 AM 09/15/2022   10:59 AM 09/09/2021   10:53 AM  Advanced Directives  Does Patient Have a Medical Advance Directive? No Yes Yes  Type of Advance Directive  Living will;Healthcare Power of Attorney Living will  Does patient want to make changes to medical advance directive?   No - Patient declined  Copy of Healthcare Power of Attorney in Chart?  No - copy requested   Would patient like information on creating a medical advance directive? No - Patient declined      Current Medications (verified) Outpatient Encounter Medications as of 09/21/2023  Medication Sig   ACCU-CHEK GUIDE TEST test strip TEST ONCE DAILY   Accu-Chek Softclix Lancets lancets TEST ONCE DAILY   ALPRAZolam  (XANAX ) 0.5 MG tablet TAKE 1 TABLET BY MOUTH TWICE A DAY   amLODipine  (NORVASC ) 5 MG tablet TAKE 1 TABLET BY MOUTH DAILY   aspirin 81 MG tablet Take 81 mg by mouth daily.   atorvastatin  (LIPITOR) 20 MG tablet TAKE 1 TABLET BY MOUTH ONCE  DAILY   Blood Glucose Calibration (ACCU-CHEK AVIVA) SOLN USE AS DIRECTED (Patient not taking: Reported on 03/16/2023)   Blood Glucose Monitoring Suppl (ACCU-CHEK GUIDE ME) w/Device KIT CHECK BLOOD SUGAR ONCE  DAILY   Cholecalciferol 2000 UNITS CAPS Take 2,000 Units by mouth 1 day or 1 dose.    Cyanocobalamin (VITAMIN B 12 PO) Take by mouth.   furosemide  (LASIX ) 20 MG tablet TAKE 1 TABLET BY MOUTH DAILY   hydrochlorothiazide  (HYDRODIURIL ) 25 MG tablet TAKE 1 TABLET BY MOUTH DAILY   Lancet Devices (AUTOLET IMPRESSION) MISC As directed (Patient not taking: Reported on 03/16/2023)   Lancets Misc. (ACCU-CHEK SOFTCLIX LANCET DEV) KIT USE ONCE DAILY FOR GLUCOSE  TESTING   metFORMIN  (GLUCOPHAGE -XR) 500 MG 24 hr tablet TAKE 1 TABLET BY MOUTH TWICE  DAILY WITH MEALS   metoprolol  tartrate (LOPRESSOR ) 25 MG tablet TAKE 1 TABLET BY MOUTH DAILY   Multiple Vitamin (MULTIVITAMIN) tablet Take 1 tablet by mouth daily.   olmesartan  (BENICAR ) 40 MG tablet TAKE 1 TABLET BY MOUTH DAILY   Omega-3 Fatty Acids (FISH OIL) 1000 MG CAPS Take by mouth.   omeprazole  (PRILOSEC) 40 MG capsule TAKE 1 CAPSULE BY MOUTH DAILY   rOPINIRole  (REQUIP ) 0.5 MG tablet TAKE 1 TABLET BY MOUTH AT  BEDTIME   No facility-administered encounter medications on file as of 09/21/2023.    Allergies (verified) Chlorzoxazone and Codeine   History: Past Medical History:  Diagnosis Date   Allergy    Anxiety    Cataract    Diabetes mellitus    GERD (gastroesophageal reflux disease)    Hematuria, microscopic    History of recurrent UTIs 05/11/2010   Hyperlipidemia    Positive TB test  Vitamin D  deficiency    Past Surgical History:  Procedure Laterality Date   ABDOMINAL HYSTERECTOMY     BREAST SURGERY  1978   l breast biopsy   CHOLECYSTECTOMY     CYSTOSTOMY W/ BLADDER BIOPSY  1984, 1989   Family History  Problem Relation Age of Onset   Dementia Mother    Diabetes Father    Diabetes Sister    Heart disease Sister    Diabetes Brother    Kidney disease Brother    HIV Daughter    Social History   Socioeconomic History   Marital status: Widowed    Spouse name: Not on file   Number of children: Not on file   Years of education: Not on file   Highest education level: Not on file  Occupational History   Not on file   Tobacco Use   Smoking status: Never   Smokeless tobacco: Never  Substance and Sexual Activity   Alcohol use: No   Drug use: No   Sexual activity: Never    Comment: Menpopausal since age 33  Other Topics Concern   Not on file  Social History Narrative   Not on file   Social Drivers of Health   Financial Resource Strain: Low Risk  (09/21/2023)   Overall Financial Resource Strain (CARDIA)    Difficulty of Paying Living Expenses: Not hard at all  Food Insecurity: No Food Insecurity (09/21/2023)   Hunger Vital Sign    Worried About Running Out of Food in the Last Year: Never true    Ran Out of Food in the Last Year: Never true  Transportation Needs: No Transportation Needs (09/21/2023)   PRAPARE - Administrator, Civil Service (Medical): No    Lack of Transportation (Non-Medical): No  Physical Activity: Insufficiently Active (09/21/2023)   Exercise Vital Sign    Days of Exercise per Week: 3 days    Minutes of Exercise per Session: 30 min  Stress: No Stress Concern Present (09/21/2023)   [employer] of Occupational Health - Occupational Stress Questionnaire    Feeling of Stress: Not at all  Social Connections: Moderately Integrated (09/21/2023)   Social Connection and Isolation Panel    Frequency of Communication with Friends and Family: More than three times a week    Frequency of Social Gatherings with Friends and Family: More than three times a week    Attends Religious Services: More than 4 times per year    Active Member of [employer] or Organizations: Yes    Attends Banker Meetings: More than 4 times per year    Marital Status: Widowed    Tobacco Counseling Counseling given: No   Clinical Intake:  Pre-visit preparation completed: Yes  Pain : No/denies pain Pain Score: 0-No pain     BMI - recorded: 34.48 Nutritional Status: BMI > 30  Obese Nutritional Risks: None Diabetes: Yes CBG done?: Yes CBG resulted in Enter/ Edit results?:  Yes Did pt. bring in CBG monitor from home?: No  How often do you need to have someone help you when you read instructions, pamphlets, or other written materials from your doctor or pharmacy?: 1 - Never  Interpreter Needed?: No  Information entered by :: Kathlynn Porto, CMA   Activities of Daily Living    09/21/2023   10:50 AM  In your present state of health, do you have any difficulty performing the following activities:  Hearing? 0  Vision? 0  Difficulty concentrating or making decisions?  0  Walking or climbing stairs? 0  Dressing or bathing? 0  Doing errands, shopping? 0  Preparing Food and eating ? N  Using the Toilet? N  In the past six months, have you accidently leaked urine? N  Do you have problems with loss of bowel control? N  Managing your Medications? N  Managing your Finances? N  Housekeeping or managing your Housekeeping? N    Patient Care Team: Perri Ronal PARAS, MD as PCP - General (Internal Medicine)  Indicate any recent Medical Services you may have received from other than [facility] providers in the past year (date may be approximate).     Assessment:   This is a routine wellness examination for Krystall.  Hearing/Vision screen No results found.   Goals Addressed   None    Depression Screen    09/21/2023   10:53 AM 09/15/2022   10:52 AM 03/10/2022   10:30 AM 09/09/2021   10:53 AM 09/02/2021    9:28 AM 09/08/2020    3:05 PM 08/13/2019   10:06 AM  PHQ 2/9 Scores  PHQ - 2 Score 0 0 0 0 0 0 0    Fall Risk    09/21/2023   10:52 AM 09/15/2022   10:52 AM 03/10/2022   10:30 AM 09/09/2021   10:53 AM 09/02/2021    9:28 AM  Fall Risk   Falls in the past year? 1 1 0 0 0  Number falls in past yr: 0 1 0 0 0  Injury with Fall?  1 0 0 0  Risk for fall due to : Other (Comment) Other (Comment) No Fall Risks No Fall Risks No Fall Risks  Follow up Falls evaluation completed;Education provided;Falls prevention discussed  Falls prevention discussed Falls evaluation  completed  Falls evaluation completed      Data saved with a previous flowsheet row definition    MEDICARE RISK AT HOME: Medicare Risk at Home Any stairs in or around the home?: No If so, are there any without handrails?: No Home free of loose throw rugs in walkways, pet beds, electrical cords, etc?: Yes Adequate lighting in your home to reduce risk of falls?: Yes Life alert?: No Use of a cane, walker or w/c?: No Grab bars in the bathroom?: Yes Shower chair or bench in shower?: No Elevated toilet seat or a handicapped toilet?: No  TIMED UP AND GO:  Was the test performed?  No    Cognitive Function:        09/21/2023   10:51 AM 09/15/2022   10:55 AM 09/09/2021   10:55 AM  6CIT Screen  What Year? 0 points 0 points 0 points  What month? 0 points 0 points 0 points  What time? 0 points 0 points 0 points  Count back from 20 0 points 0 points 0 points  Months in reverse 0 points 0 points 0 points  Repeat phrase 0 points 0 points 0 points  Total Score 0 points 0 points 0 points    Immunizations Immunization History  Administered Date(s) Administered   Fluad Quad(high Dose 65+) 09/07/2018   INFLUENZA, HIGH DOSE SEASONAL PF 09/14/2017, 09/25/2019, 09/21/2023   Influenza Split 11/14/2011   Influenza, Seasonal, Injecte, Preservative Fre 09/15/2022   Influenza,inj,Quad PF,6+ Mos 10/23/2018, 09/08/2020, 09/02/2021   Influenza-Unspecified 10/04/2013, 09/09/2014, 09/30/2016   PFIZER Comirnaty(Gray Top)Covid-19 Tri-Sucrose Vaccine 05/21/2020   PFIZER(Purple Top)SARS-COV-2 Vaccination 01/18/2019, 02/07/2019, 10/20/2019   PNEUMOCOCCAL CONJUGATE-20 11/10/2021   Pfizer Covid-19 Vaccine Bivalent Booster 3yrs & up 10/31/2020  Pfizer(Comirnaty)Fall Seasonal Vaccine 12 years and older 10/14/2021   Pneumococcal Conjugate-13 07/15/2014   Pneumococcal Polysaccharide-23 11/10/2009   Tdap 11/16/2010, 11/03/2021   Varicella 11/10/2009   Zoster Recombinant(Shingrix) 03/07/2018, 07/24/2018    Zoster, Live 11/10/2009    TDAP status: Up to date  Flu Vaccine status: Completed at today's visit  Pneumococcal vaccine status: Up to date  Covid-19 vaccine status: Information provided on how to obtain vaccines.   Qualifies for Shingles Vaccine? Yes   Zostavax completed Yes   Shingrix Completed?: Yes  Screening Tests Health Maintenance  Topic Date Due   Colonoscopy  01/20/2023   Mammogram  01/09/2024   FOOT EXAM  03/15/2024   HEMOGLOBIN A1C  03/18/2024   OPHTHALMOLOGY EXAM  05/22/2024   Diabetic kidney evaluation - eGFR measurement  09/18/2024   Diabetic kidney evaluation - Urine ACR  09/18/2024   Medicare Annual Wellness (AWV)  09/18/2024   DTaP/Tdap/Td (3 - Td or Tdap) 11/04/2031   Pneumococcal Vaccine: 50+ Years  Completed   Influenza Vaccine  Completed   DEXA SCAN  Completed   Zoster Vaccines- Shingrix  Completed   HPV VACCINES  Aged Out   Meningococcal B Vaccine  Aged Out   COVID-19 Vaccine  Discontinued    Health Maintenance  Health Maintenance Due  Topic Date Due   Colonoscopy  01/20/2023    Colorectal cancer screening: Type of screening: Colonoscopy. Completed 01/19/22. Repeat every prn pathology results years  Mammogram status: Completed 01/09/2023. Repeat every year  Bone Density status: Completed 01/05/2022. Results reflect: Bone density results: NORMAL. Repeat every 2 years.  Lung Cancer Screening: (Low Dose CT Chest recommended if Age 40-80 years, 20 pack-year currently smoking OR have quit w/in 15years.) does not qualify.   Additional Screening:  Hepatitis C Screening: does not qualify; Completed   Vision Screening: Recommended annual ophthalmology exams for early detection of glaucoma and other disorders of the eye. Is the patient up to date with their annual eye exam?  Yes  Who is the provider or what is the name of the office in which the patient attends annual eye exams? [facility]  If pt is not established with a provider,  would they like to be referred to a provider to establish care? No .   Dental Screening: Recommended annual dental exams for proper oral hygiene  Diabetic Foot Exam: Diabetic Foot Exam: Completed 09/21/2023  Community Resource Referral / Chronic Care Management: CRR required this visit?  No   CCM required this visit?  No     Plan:     I have personally reviewed and noted the following in the patient's chart:   Medical and social history Use of alcohol, tobacco or illicit drugs  Current medications and supplements including opioid prescriptions. Patient is not currently taking opioid prescriptions. Functional ability and status Nutritional status Physical activity Advanced directives List of other physicians Hospitalizations, surgeries, and ER visits in previous 12 months Vitals Screenings to include cognitive, depression, and falls Referrals and appointments  In addition, I have reviewed and discussed with patient certain preventive protocols, quality metrics, and best practice recommendations. A written personalized care plan for preventive services as well as general preventive health recommendations were provided to patient.     Araceli Zelda, CMA   09/21/2023   After Visit Summary: (In Person-Printed) AVS printed and given to the patient   I, Ronal JINNY Hailstone, MD, have reviewed all documentation for this visit. The documentation on 09/21/2023 for the exam, diagnosis, procedures,  and orders are all accurate and complete.   I have reviewed and agree with the above Annual Wellness Visit documentation.  Ronal Norleen Hailstone, MD. Internal Medicine 09/21/2023

## 2023-10-17 ENCOUNTER — Other Ambulatory Visit: Payer: Self-pay | Admitting: Internal Medicine

## 2023-11-03 ENCOUNTER — Other Ambulatory Visit: Payer: Self-pay | Admitting: Internal Medicine

## 2024-01-11 LAB — HM MAMMOGRAPHY

## 2024-01-12 ENCOUNTER — Encounter: Payer: Self-pay | Admitting: Internal Medicine

## 2024-01-16 ENCOUNTER — Other Ambulatory Visit: Payer: Self-pay | Admitting: Internal Medicine

## 2024-01-25 ENCOUNTER — Other Ambulatory Visit: Payer: Self-pay | Admitting: Internal Medicine

## 2024-01-25 NOTE — Telephone Encounter (Signed)
 Last refill 06/26/23 #60 + 5 refills

## 2024-03-19 ENCOUNTER — Other Ambulatory Visit: Payer: Self-pay

## 2024-03-21 ENCOUNTER — Ambulatory Visit: Payer: Self-pay | Admitting: Internal Medicine
